# Patient Record
Sex: Male | Born: 1945
Health system: Southern US, Community
[De-identification: ages and names within clinical notes are randomized; demographics above are authoritative.]

## PROBLEM LIST (undated history)

## (undated) DIAGNOSIS — I509 Heart failure, unspecified: Secondary | ICD-10-CM

## (undated) DIAGNOSIS — I493 Ventricular premature depolarization: Secondary | ICD-10-CM

## (undated) HISTORY — PX: CIRCUMCISION: SUR203

## (undated) HISTORY — PX: HEMORRHOID SURGERY: SHX153

## (undated) HISTORY — PX: HERNIA REPAIR: SHX51

## (undated) HISTORY — DX: Heart failure, unspecified: I50.9

## (undated) HISTORY — DX: Ventricular premature depolarization: I49.3

---

## 2006-06-28 ENCOUNTER — Ambulatory Visit: Payer: Self-pay | Admitting: Gastroenterology

## 2014-07-29 ENCOUNTER — Ambulatory Visit: Payer: Self-pay | Admitting: Family Medicine

## 2015-08-27 ENCOUNTER — Encounter: Payer: Self-pay | Admitting: Family Medicine

## 2015-08-27 ENCOUNTER — Ambulatory Visit (INDEPENDENT_AMBULATORY_CARE_PROVIDER_SITE_OTHER): Payer: Medicare HMO | Admitting: Family Medicine

## 2015-08-27 VITALS — BP 138/78 | HR 65 | Temp 98.0°F | Resp 18 | Ht 73.0 in | Wt 197.5 lb

## 2015-08-27 DIAGNOSIS — Z8546 Personal history of malignant neoplasm of prostate: Secondary | ICD-10-CM

## 2015-08-27 DIAGNOSIS — Z0000 Encounter for general adult medical examination without abnormal findings: Secondary | ICD-10-CM

## 2015-08-27 DIAGNOSIS — G4700 Insomnia, unspecified: Secondary | ICD-10-CM | POA: Diagnosis not present

## 2015-08-27 DIAGNOSIS — Z559 Problems related to education and literacy, unspecified: Secondary | ICD-10-CM | POA: Diagnosis not present

## 2015-08-27 DIAGNOSIS — F17200 Nicotine dependence, unspecified, uncomplicated: Secondary | ICD-10-CM

## 2015-08-27 MED ORDER — TRAZODONE HCL 50 MG PO TABS: 25.0000 mg | ORAL_TABLET | Freq: Every evening | ORAL | Status: DC | PRN

## 2015-08-27 NOTE — Progress Notes (Signed)
Name: Anthony Richard   MRN: [id number]    DOB: [birth year]   Date:08/27/2015       Progress Note  Subjective  Chief Complaint  Chief Complaint  Patient presents with  . Annual Exam    HPI  69 year old male presenting for annual H&P.  Depression screen PHQ 2/9 08/27/2015  Decreased Interest 0  Down, Depressed, Hopeless 0  PHQ - 2 Score 0   Functional Status Survey: Is the patient deaf or have difficulty hearing?: No Does the patient have difficulty seeing, even when wearing glasses/contacts?: No Does the patient have difficulty concentrating, remembering, or making decisions?: No Does the patient have difficulty walking or climbing stairs?: No Does the patient have difficulty dressing or bathing?: No Does the patient have difficulty doing errands alone such as visiting a doctor's office or shopping?: No  Fall Risk  08/27/2015  Falls in the past year? No    History reviewed. No pertinent past medical history.  Social History  Substance Use Topics  . Smoking status: Current Every Day Smoker  . Smokeless tobacco: Not on file  . Alcohol Use: No     Current outpatient prescriptions:  .  Acetaminophen (TYLENOL ARTHRITIS EXT RELIEF PO), Take by mouth., Disp: , Rfl:   Allergies  Allergen Reactions  . Penicillins Swelling    Review of Systems  Constitutional: Negative for fever, chills and weight loss.  HENT: Negative for congestion, hearing loss, sore throat and tinnitus.   Eyes: Negative for blurred vision, double vision and redness.  Respiratory: Negative for cough, hemoptysis and shortness of breath.   Cardiovascular: Negative for chest pain, palpitations, orthopnea, claudication and leg swelling.  Gastrointestinal: Negative for heartburn, nausea, vomiting, diarrhea, constipation and blood in stool.  Genitourinary: Negative for dysuria, urgency, frequency and hematuria.  Musculoskeletal: Negative for myalgias, back pain, joint pain, falls and neck pain.  Skin:  Negative for itching.  Neurological: Negative for dizziness, tingling, tremors, focal weakness, seizures, loss of consciousness, weakness and headaches.  Endo/Heme/Allergies: Does not bruise/bleed easily.  Psychiatric/Behavioral: Negative for depression and substance abuse. The patient is not nervous/anxious and does not have insomnia.      Objective  Filed Vitals:   08/27/15 0921  BP: 138/78  Pulse: 65  Temp: 98 F (36.7 C)  Resp: 18  Height:  (1.854 m)  Weight: 197 lb 8 oz (89.585 kg)  SpO2: 97%     Physical Exam  Constitutional: He is oriented to person, place, and time and well-developed, well-nourished, and in no distress.  HENT:  Head: Normocephalic.  Eyes: EOM are normal. Pupils are equal, round, and reactive to light.  Neck: Normal range of motion. Neck supple. No thyromegaly present.  Cardiovascular: Normal rate, regular rhythm and normal heart sounds.   No murmur heard. Pulmonary/Chest: Effort normal and breath sounds normal. No respiratory distress. He has no wheezes.  Abdominal: Soft. Bowel sounds are normal.  Genitourinary: Rectum normal, prostate normal and penis normal. Guaiac negative stool. No discharge found.  Musculoskeletal: Normal range of motion. He exhibits no edema.  Lymphadenopathy:    He has no cervical adenopathy.  Neurological: He is alert and oriented to person, place, and time. No cranial nerve deficit. Gait normal. Coordination normal.  Skin: Skin is warm and dry. No rash noted.  Psychiatric: Affect and judgment normal.      Assessment & Plan  1. Annual physical exam   2. Needs smoking cessation education Handout is been given  3. Insomnia Trial of trazodone view  of his age - traZODone (DESYREL) 50 MG tablet; Take 0.5-1 tablets (25-50 mg total) by mouth at bedtime as needed for sleep.  Dispense: 30 tablet; Refill: 5  4. History of prostate cancer Obtain PSA and follow up with urologist as needed - PSA

## 2015-08-27 NOTE — Patient Instructions (Signed)
Smoking Cessation, Tips for Success If you are ready to quit smoking, congratulations! You have chosen to help yourself be healthier. Cigarettes bring nicotine, tar, carbon monoxide, and other irritants into your body. Your lungs, heart, and blood vessels will be able to work better without these poisons. There are many different ways to quit smoking. Nicotine gum, nicotine patches, a nicotine inhaler, or nicotine nasal spray can help with physical craving. Hypnosis, support groups, and medicines help break the habit of smoking. WHAT THINGS CAN I DO TO MAKE QUITTING EASIER?  Here are some tips to help you quit for good:  Pick a date when you will quit smoking completely. Tell all of your friends and family about your plan to quit on that date.  Do not try to slowly cut down on the number of cigarettes you are smoking. Pick a quit date and quit smoking completely starting on that day.  Throw away all cigarettes.   Clean and remove all ashtrays from your home, work, and car.  On a card, write down your reasons for quitting. Carry the card with you and read it when you get the urge to smoke.  Cleanse your body of nicotine. Drink enough water and fluids to keep your urine clear or pale yellow. Do this after quitting to flush the nicotine from your body.  Learn to predict your moods. Do not let a bad situation be your excuse to have a cigarette. Some situations in your life might tempt you into wanting a cigarette.  Never have "just one" cigarette. It leads to wanting another and another. Remind yourself of your decision to quit.  Change habits associated with smoking. If you smoked while driving or when feeling stressed, try other activities to replace smoking. Stand up when drinking your coffee. Brush your teeth after eating. Sit in a different chair when you read the paper. Avoid alcohol while trying to quit, and try to drink fewer caffeinated beverages. Alcohol and caffeine may urge you to  smoke.  Avoid foods and drinks that can trigger a desire to smoke, such as sugary or spicy foods and alcohol.  Ask people who smoke not to smoke around you.  Have something planned to do right after eating or having a cup of coffee. For example, plan to take a walk or exercise.  Try a relaxation exercise to calm you down and decrease your stress. Remember, you may be tense and nervous for the first 2 weeks after you quit, but this will pass.  Find new activities to keep your hands busy. Play with a pen, coin, or rubber band. Doodle or draw things on paper.  Brush your teeth right after eating. This will help cut down on the craving for the taste of tobacco after meals. You can also try mouthwash.   Use oral substitutes in place of cigarettes. Try using lemon drops, carrots, cinnamon sticks, or chewing gum. Keep them handy so they are available when you have the urge to smoke.  When you have the urge to smoke, try deep breathing.  Designate your home as a nonsmoking area.  If you are a heavy smoker, ask your health care provider about a prescription for nicotine chewing gum. It can ease your withdrawal from nicotine.  Reward yourself. Set aside the cigarette money you save and buy yourself something nice.  Look for support from others. Join a support group or smoking cessation program. Ask someone at home or at work to help you with your plan   to quit smoking.  Always ask yourself, "Do I need this cigarette or is this just a reflex?" Tell yourself, "Today, I choose not to smoke," or "I do not want to smoke." You are reminding yourself of your decision to quit.  Do not replace cigarette smoking with electronic cigarettes (commonly called e-cigarettes). The safety of e-cigarettes is unknown, and some may contain harmful chemicals.  If you relapse, do not give up! Plan ahead and think about what you will do the next time you get the urge to smoke. HOW WILL I FEEL WHEN I QUIT SMOKING? You  may have symptoms of withdrawal because your body is used to nicotine (the addictive substance in cigarettes). You may crave cigarettes, be irritable, feel very hungry, cough often, get headaches, or have difficulty concentrating. The withdrawal symptoms are only temporary. They are strongest when you first quit but will go away within 10-14 days. When withdrawal symptoms occur, stay in control. Think about your reasons for quitting. Remind yourself that these are signs that your body is healing and getting used to being without cigarettes. Remember that withdrawal symptoms are easier to treat than the major diseases that smoking can cause.  Even after the withdrawal is over, expect periodic urges to smoke. However, these cravings are generally short lived and will go away whether you smoke or not. Do not smoke! WHAT RESOURCES ARE AVAILABLE TO HELP ME QUIT SMOKING? Your health care provider can direct you to community resources or hospitals for support, which may include:  Group support.  Education.  Hypnosis.  Therapy.   This information is not intended to replace advice given to you by your health care provider. Make sure you discuss any questions you have with your health care provider.   Document Released: 05/14/2004 Document Revised: 09/06/2014 Document Reviewed: 02/01/2013 Elsevier Interactive Patient Education 2016 [employer]  

## 2015-09-24 DIAGNOSIS — Z0000 Encounter for general adult medical examination without abnormal findings: Secondary | ICD-10-CM | POA: Diagnosis not present

## 2015-09-25 ENCOUNTER — Telehealth: Payer: Self-pay | Admitting: Emergency Medicine

## 2015-09-25 LAB — PSA: PROSTATE SPECIFIC AG, SERUM: 1.3 ng/mL (ref 0.0–4.0)

## 2015-09-25 NOTE — Telephone Encounter (Signed)
Patient notified of labs letter sent.

## 2015-12-06 IMAGING — CR DG CHEST 2V
1 series · 2 of 2 positions shown · non-contrast
Comparison: None.

CLINICAL DATA: Clubbing of the nails; no respiratory symptoms;
nonsmoker

EXAM:
CHEST  2 VIEW

[Series 1: kdxr chest pa (or ap) and lat · 0.14mm/px · 2 of 2 slices shown]
[im 1/2]
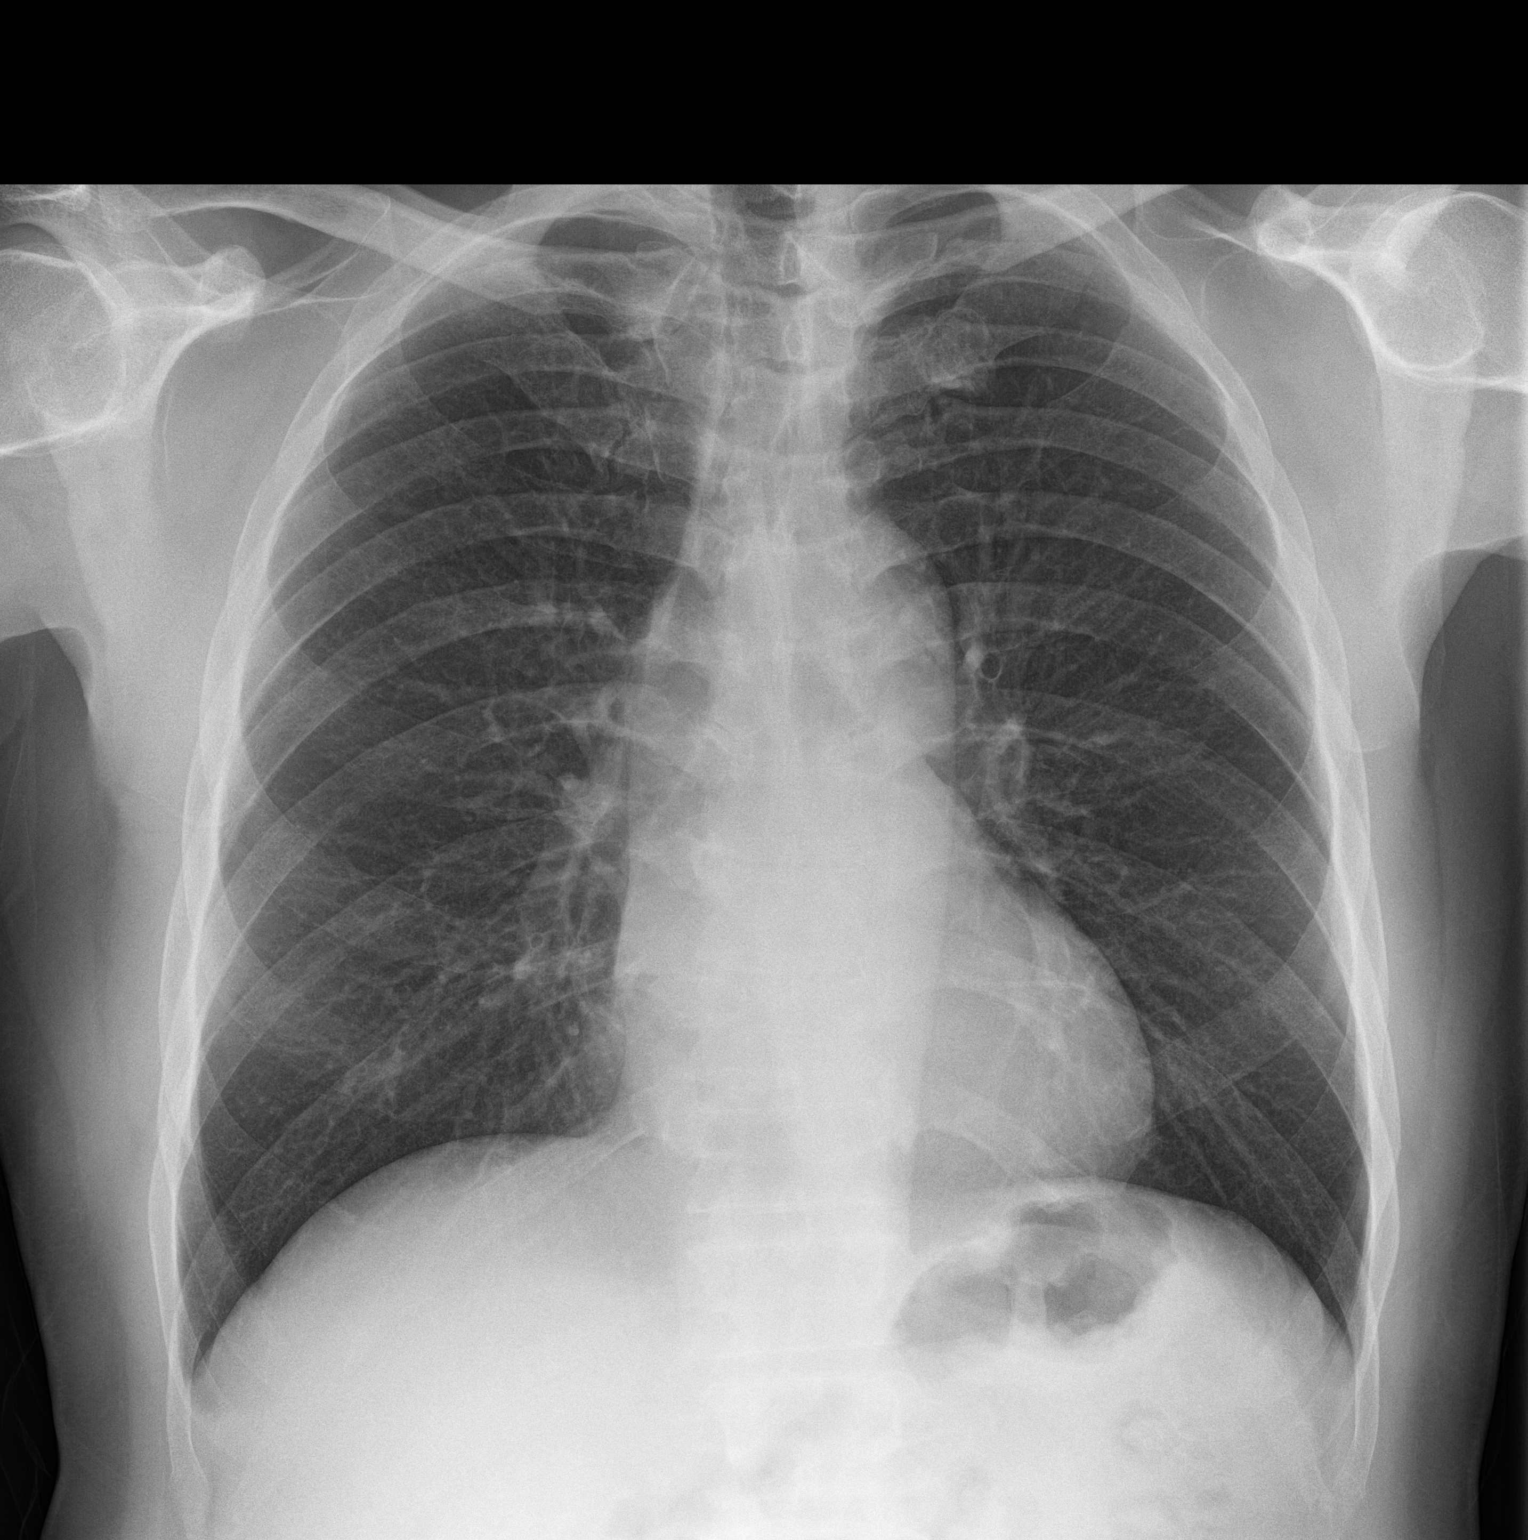
[im 2/2]
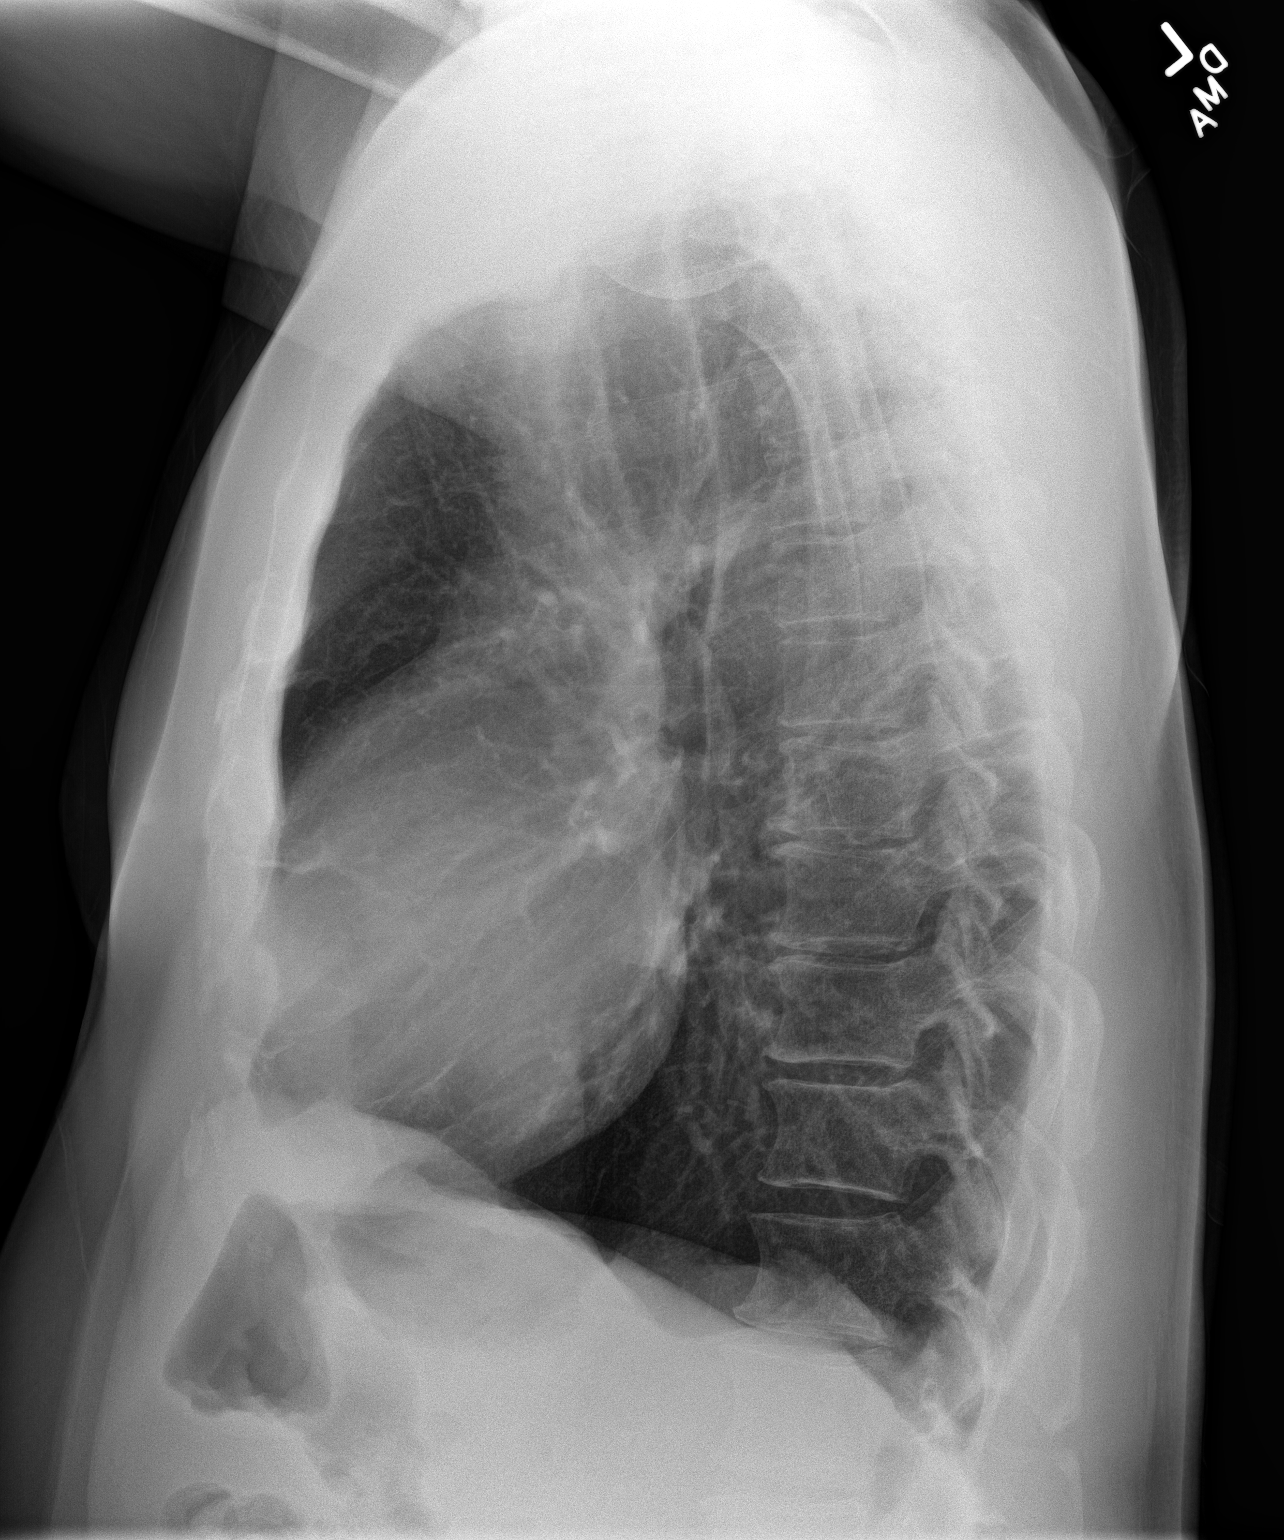

[2 of 2 positions shown; findings below may reference images not displayed]

FINDINGS: The lungs are mildly hyperinflated but clear. The heart and
pulmonary vascularity are normal. The trachea is midline. There is
no pleural effusion or pneumothorax. The bony thorax is
unremarkable.
IMPRESSION: Mild hyperinflation may be voluntary or could reflect underlying
COPD. There is no acute cardiopulmonary abnormality.

## 2016-02-25 ENCOUNTER — Ambulatory Visit: Payer: Medicare HMO | Admitting: Family Medicine

## 2016-12-27 ENCOUNTER — Telehealth: Payer: Self-pay | Admitting: Family Medicine

## 2016-12-27 NOTE — Telephone Encounter (Signed)
Called Pt to schedule AWV with [facility] - knb °

## 2018-01-16 LAB — FECAL OCCULT BLOOD, GUAIAC: FECAL OCCULT BLD: NEGATIVE

## 2018-01-24 ENCOUNTER — Encounter: Payer: Self-pay | Admitting: Family Medicine

## 2018-02-02 DIAGNOSIS — Z1159 Encounter for screening for other viral diseases: Secondary | ICD-10-CM | POA: Diagnosis not present

## 2018-02-02 DIAGNOSIS — R9431 Abnormal electrocardiogram [ECG] [EKG]: Secondary | ICD-10-CM | POA: Diagnosis not present

## 2018-02-02 DIAGNOSIS — Z125 Encounter for screening for malignant neoplasm of prostate: Secondary | ICD-10-CM | POA: Diagnosis not present

## 2018-02-02 DIAGNOSIS — R609 Edema, unspecified: Secondary | ICD-10-CM | POA: Diagnosis not present

## 2018-02-02 DIAGNOSIS — I83893 Varicose veins of bilateral lower extremities with other complications: Secondary | ICD-10-CM | POA: Diagnosis not present

## 2018-02-02 DIAGNOSIS — Z1211 Encounter for screening for malignant neoplasm of colon: Secondary | ICD-10-CM | POA: Diagnosis not present

## 2018-02-02 DIAGNOSIS — I499 Cardiac arrhythmia, unspecified: Secondary | ICD-10-CM | POA: Diagnosis not present

## 2018-03-06 DIAGNOSIS — R9431 Abnormal electrocardiogram [ECG] [EKG]: Secondary | ICD-10-CM | POA: Diagnosis not present

## 2018-03-06 DIAGNOSIS — R683 Clubbing of fingers: Secondary | ICD-10-CM | POA: Diagnosis not present

## 2018-03-06 DIAGNOSIS — I499 Cardiac arrhythmia, unspecified: Secondary | ICD-10-CM | POA: Diagnosis not present

## 2018-03-06 DIAGNOSIS — R609 Edema, unspecified: Secondary | ICD-10-CM | POA: Diagnosis not present

## 2018-03-17 DIAGNOSIS — R0602 Shortness of breath: Secondary | ICD-10-CM | POA: Diagnosis not present

## 2018-03-17 DIAGNOSIS — R002 Palpitations: Secondary | ICD-10-CM | POA: Diagnosis not present

## 2018-03-17 DIAGNOSIS — R600 Localized edema: Secondary | ICD-10-CM | POA: Diagnosis not present

## 2018-03-17 DIAGNOSIS — I493 Ventricular premature depolarization: Secondary | ICD-10-CM | POA: Diagnosis not present

## 2018-03-29 DIAGNOSIS — R0602 Shortness of breath: Secondary | ICD-10-CM | POA: Diagnosis not present

## 2018-06-02 DIAGNOSIS — N183 Chronic kidney disease, stage 3 (moderate): Secondary | ICD-10-CM | POA: Diagnosis not present

## 2018-06-02 DIAGNOSIS — R609 Edema, unspecified: Secondary | ICD-10-CM | POA: Diagnosis not present

## 2018-06-02 DIAGNOSIS — R9431 Abnormal electrocardiogram [ECG] [EKG]: Secondary | ICD-10-CM | POA: Diagnosis not present

## 2018-06-02 DIAGNOSIS — I499 Cardiac arrhythmia, unspecified: Secondary | ICD-10-CM | POA: Diagnosis not present

## 2018-06-02 DIAGNOSIS — Z87891 Personal history of nicotine dependence: Secondary | ICD-10-CM | POA: Diagnosis not present

## 2018-06-02 DIAGNOSIS — R7989 Other specified abnormal findings of blood chemistry: Secondary | ICD-10-CM | POA: Diagnosis not present

## 2018-07-07 DIAGNOSIS — Z87891 Personal history of nicotine dependence: Secondary | ICD-10-CM | POA: Diagnosis not present

## 2018-07-07 DIAGNOSIS — R899 Unspecified abnormal finding in specimens from other organs, systems and tissues: Secondary | ICD-10-CM | POA: Diagnosis not present

## 2018-07-07 DIAGNOSIS — I493 Ventricular premature depolarization: Secondary | ICD-10-CM | POA: Diagnosis not present

## 2018-07-07 DIAGNOSIS — R600 Localized edema: Secondary | ICD-10-CM | POA: Diagnosis not present

## 2018-07-07 DIAGNOSIS — G4700 Insomnia, unspecified: Secondary | ICD-10-CM | POA: Diagnosis not present

## 2018-07-14 DIAGNOSIS — R079 Chest pain, unspecified: Secondary | ICD-10-CM | POA: Diagnosis not present

## 2018-07-14 DIAGNOSIS — R0602 Shortness of breath: Secondary | ICD-10-CM | POA: Diagnosis not present

## 2018-07-14 DIAGNOSIS — I493 Ventricular premature depolarization: Secondary | ICD-10-CM | POA: Diagnosis not present

## 2018-07-14 DIAGNOSIS — R600 Localized edema: Secondary | ICD-10-CM | POA: Diagnosis not present

## 2018-07-14 DIAGNOSIS — I059 Rheumatic mitral valve disease, unspecified: Secondary | ICD-10-CM | POA: Diagnosis not present

## 2018-07-14 DIAGNOSIS — I429 Cardiomyopathy, unspecified: Secondary | ICD-10-CM | POA: Diagnosis not present

## 2018-07-18 DIAGNOSIS — R0602 Shortness of breath: Secondary | ICD-10-CM | POA: Diagnosis not present

## 2018-07-18 DIAGNOSIS — R899 Unspecified abnormal finding in specimens from other organs, systems and tissues: Secondary | ICD-10-CM | POA: Diagnosis not present

## 2018-07-18 DIAGNOSIS — R600 Localized edema: Secondary | ICD-10-CM | POA: Diagnosis not present

## 2018-07-18 DIAGNOSIS — I493 Ventricular premature depolarization: Secondary | ICD-10-CM | POA: Diagnosis not present

## 2018-07-18 DIAGNOSIS — I059 Rheumatic mitral valve disease, unspecified: Secondary | ICD-10-CM | POA: Diagnosis not present

## 2018-07-18 DIAGNOSIS — I429 Cardiomyopathy, unspecified: Secondary | ICD-10-CM | POA: Diagnosis not present

## 2018-07-18 DIAGNOSIS — G4700 Insomnia, unspecified: Secondary | ICD-10-CM | POA: Diagnosis not present

## 2018-07-18 DIAGNOSIS — R002 Palpitations: Secondary | ICD-10-CM | POA: Diagnosis not present

## 2018-08-04 DIAGNOSIS — R079 Chest pain, unspecified: Secondary | ICD-10-CM | POA: Diagnosis not present

## 2018-08-04 DIAGNOSIS — I493 Ventricular premature depolarization: Secondary | ICD-10-CM | POA: Diagnosis not present

## 2018-08-04 DIAGNOSIS — R0602 Shortness of breath: Secondary | ICD-10-CM | POA: Diagnosis not present

## 2018-08-04 DIAGNOSIS — R600 Localized edema: Secondary | ICD-10-CM | POA: Diagnosis not present

## 2018-08-15 DIAGNOSIS — R600 Localized edema: Secondary | ICD-10-CM | POA: Diagnosis not present

## 2018-08-15 DIAGNOSIS — I059 Rheumatic mitral valve disease, unspecified: Secondary | ICD-10-CM | POA: Diagnosis not present

## 2018-08-15 DIAGNOSIS — I493 Ventricular premature depolarization: Secondary | ICD-10-CM | POA: Diagnosis not present

## 2018-08-15 DIAGNOSIS — I429 Cardiomyopathy, unspecified: Secondary | ICD-10-CM | POA: Diagnosis not present

## 2018-08-29 DIAGNOSIS — N189 Chronic kidney disease, unspecified: Secondary | ICD-10-CM | POA: Diagnosis not present

## 2018-08-29 DIAGNOSIS — N3289 Other specified disorders of bladder: Secondary | ICD-10-CM | POA: Diagnosis not present

## 2018-08-29 DIAGNOSIS — N261 Atrophy of kidney (terminal): Secondary | ICD-10-CM | POA: Diagnosis not present

## 2018-10-04 DIAGNOSIS — I340 Nonrheumatic mitral (valve) insufficiency: Secondary | ICD-10-CM | POA: Diagnosis not present

## 2018-10-04 DIAGNOSIS — I509 Heart failure, unspecified: Secondary | ICD-10-CM | POA: Diagnosis not present

## 2018-10-04 DIAGNOSIS — I493 Ventricular premature depolarization: Secondary | ICD-10-CM | POA: Diagnosis not present

## 2018-10-04 DIAGNOSIS — N3289 Other specified disorders of bladder: Secondary | ICD-10-CM | POA: Diagnosis not present

## 2018-10-04 DIAGNOSIS — I5020 Unspecified systolic (congestive) heart failure: Secondary | ICD-10-CM | POA: Diagnosis not present

## 2018-10-04 DIAGNOSIS — Z87891 Personal history of nicotine dependence: Secondary | ICD-10-CM | POA: Diagnosis not present

## 2018-10-04 DIAGNOSIS — N329 Bladder disorder, unspecified: Secondary | ICD-10-CM | POA: Diagnosis not present

## 2018-10-04 DIAGNOSIS — R9431 Abnormal electrocardiogram [ECG] [EKG]: Secondary | ICD-10-CM | POA: Diagnosis not present

## 2018-10-04 DIAGNOSIS — Z01811 Encounter for preprocedural respiratory examination: Secondary | ICD-10-CM | POA: Diagnosis not present

## 2018-10-04 DIAGNOSIS — Z880 Allergy status to penicillin: Secondary | ICD-10-CM | POA: Diagnosis not present

## 2018-10-04 DIAGNOSIS — N183 Chronic kidney disease, stage 3 (moderate): Secondary | ICD-10-CM | POA: Diagnosis not present

## 2018-10-04 DIAGNOSIS — R001 Bradycardia, unspecified: Secondary | ICD-10-CM | POA: Diagnosis not present

## 2018-10-06 DIAGNOSIS — I493 Ventricular premature depolarization: Secondary | ICD-10-CM | POA: Diagnosis not present

## 2018-10-06 DIAGNOSIS — R001 Bradycardia, unspecified: Secondary | ICD-10-CM | POA: Diagnosis not present

## 2018-10-06 DIAGNOSIS — R002 Palpitations: Secondary | ICD-10-CM | POA: Diagnosis not present

## 2018-10-06 DIAGNOSIS — I10 Essential (primary) hypertension: Secondary | ICD-10-CM | POA: Diagnosis not present

## 2018-10-06 DIAGNOSIS — R600 Localized edema: Secondary | ICD-10-CM | POA: Diagnosis not present

## 2018-10-06 DIAGNOSIS — I429 Cardiomyopathy, unspecified: Secondary | ICD-10-CM | POA: Diagnosis not present

## 2018-11-02 DIAGNOSIS — I493 Ventricular premature depolarization: Secondary | ICD-10-CM | POA: Diagnosis not present

## 2018-11-02 DIAGNOSIS — I429 Cardiomyopathy, unspecified: Secondary | ICD-10-CM | POA: Diagnosis not present

## 2018-11-02 DIAGNOSIS — R600 Localized edema: Secondary | ICD-10-CM | POA: Diagnosis not present

## 2018-11-02 DIAGNOSIS — R002 Palpitations: Secondary | ICD-10-CM | POA: Diagnosis not present

## 2018-11-02 DIAGNOSIS — I059 Rheumatic mitral valve disease, unspecified: Secondary | ICD-10-CM | POA: Diagnosis not present

## 2018-11-08 DIAGNOSIS — I509 Heart failure, unspecified: Secondary | ICD-10-CM | POA: Diagnosis not present

## 2018-11-08 DIAGNOSIS — Z880 Allergy status to penicillin: Secondary | ICD-10-CM | POA: Diagnosis not present

## 2018-11-08 DIAGNOSIS — N3289 Other specified disorders of bladder: Secondary | ICD-10-CM | POA: Diagnosis not present

## 2018-11-08 DIAGNOSIS — R9341 Abnormal radiologic findings on diagnostic imaging of renal pelvis, ureter, or bladder: Secondary | ICD-10-CM | POA: Diagnosis not present

## 2018-11-08 DIAGNOSIS — N2882 Megaloureter: Secondary | ICD-10-CM | POA: Diagnosis not present

## 2018-11-08 DIAGNOSIS — N329 Bladder disorder, unspecified: Secondary | ICD-10-CM | POA: Diagnosis not present

## 2018-11-08 DIAGNOSIS — Z87891 Personal history of nicotine dependence: Secondary | ICD-10-CM | POA: Diagnosis not present

## 2018-11-08 DIAGNOSIS — N189 Chronic kidney disease, unspecified: Secondary | ICD-10-CM | POA: Diagnosis not present

## 2018-11-30 DIAGNOSIS — I429 Cardiomyopathy, unspecified: Secondary | ICD-10-CM | POA: Diagnosis not present

## 2018-11-30 DIAGNOSIS — I059 Rheumatic mitral valve disease, unspecified: Secondary | ICD-10-CM | POA: Diagnosis not present

## 2019-04-11 ENCOUNTER — Other Ambulatory Visit: Payer: Self-pay | Admitting: Medical Oncology

## 2019-04-11 DIAGNOSIS — Z136 Encounter for screening for cardiovascular disorders: Secondary | ICD-10-CM

## 2020-09-25 ENCOUNTER — Other Ambulatory Visit: Payer: Self-pay | Admitting: Family Medicine

## 2020-09-25 DIAGNOSIS — Z136 Encounter for screening for cardiovascular disorders: Secondary | ICD-10-CM

## 2020-11-25 LAB — COLOGUARD

## 2021-02-11 LAB — COLOGUARD

## 2022-06-22 ENCOUNTER — Other Ambulatory Visit (HOSPITAL_COMMUNITY): Payer: Self-pay | Admitting: Family Medicine

## 2022-06-22 ENCOUNTER — Other Ambulatory Visit: Payer: Self-pay | Admitting: Family Medicine

## 2022-06-22 DIAGNOSIS — Z87891 Personal history of nicotine dependence: Secondary | ICD-10-CM

## 2022-06-22 DIAGNOSIS — Z136 Encounter for screening for cardiovascular disorders: Secondary | ICD-10-CM

## 2022-07-02 ENCOUNTER — Ambulatory Visit: Payer: Self-pay

## 2023-04-25 ENCOUNTER — Telehealth: Payer: Self-pay

## 2023-04-25 NOTE — Telephone Encounter (Addendum)
Attempted to sched pt from [facility] referral. No answer and no voicemail set up.   05/02/29 Pt scheduled for 9/9 @ 1500.

## 2023-05-06 NOTE — Progress Notes (Unsigned)
Advanced Heart Failure Clinic Note   Referring Physician: Fermin Schwab, PA [facility]) PCP: Anthony Mascot, MD Cardiologist: Anthony Peng, MD (last seen 08/24)  HPI:  [name] is a 77 y/o male with a history of  Has not been admitted or been in the ED in the last 6 months.   Echo 04/11/23: EF 25%  Stress test 04/11/23: 1.  Negative ETT though specificity limited by baseline ECG abnormalities  2.  Severely reduced left ventricular function with estimated LV ejection fraction 20%  3.  Global hypokinesis  4.  No tentative evidence of for scar or ischemia   He presents today for his initial visit with a chief complaint of    Review of Systems: [y] = yes, [ ]  = no   General: Weight gain [ ] ; Weight loss [ ] ; Anorexia [ ] ; Fatigue [ ] ; Fever [ ] ; Chills [ ] ; Weakness [ ]   Cardiac: Chest pain/pressure [ ] ; Resting SOB [ ] ; Exertional SOB [ ] ; Orthopnea [ ] ; Pedal Edema [ ] ; Palpitations [ ] ; Syncope [ ] ; Presyncope [ ] ; Paroxysmal nocturnal dyspnea[ ]   Pulmonary: Cough [ ] ; Wheezing[ ] ; Hemoptysis[ ] ; Sputum [ ] ; Snoring [ ]   GI: Vomiting[ ] ; Dysphagia[ ] ; Melena[ ] ; Hematochezia [ ] ; Heartburn[ ] ; Abdominal pain [ ] ; Constipation [ ] ; Diarrhea [ ] ; BRBPR [ ]   GU: Hematuria[ ] ; Dysuria [ ] ; Nocturia[ ]   Vascular: Pain in legs with walking [ ] ; Pain in feet with lying flat [ ] ; Non-healing sores [ ] ; Stroke [ ] ; TIA [ ] ; Slurred speech [ ] ;  Neuro: Headaches[ ] ; Vertigo[ ] ; Seizures[ ] ; Paresthesias[ ] ;Blurred vision [ ] ; Diplopia [ ] ; Vision changes [ ]   Ortho/Skin: Arthritis [ ] ; Joint pain [ ] ; Muscle pain [ ] ; Joint swelling [ ] ; Back Pain [ ] ; Rash [ ]   Psych: Depression[ ] ; Anxiety[ ]   Heme: Bleeding problems [ ] ; Clotting disorders [ ] ; Anemia [ ]   Endocrine: Diabetes [ ] ; Thyroid dysfunction[ ]    No past medical history on file.  Current Outpatient Medications  Medication Sig Dispense Refill   Acetaminophen (TYLENOL ARTHRITIS EXT RELIEF PO) Take by mouth.      traZODone (DESYREL) 50 MG tablet Take 0.5-1 tablets (25-50 mg total) by mouth at bedtime as needed for sleep. 30 tablet 5   No current facility-administered medications for this visit.    Allergies  Allergen Reactions   Penicillins Swelling      Social History   Socioeconomic History   Marital status: Married    Spouse name: Not on file   Number of children: Not on file   Years of education: Not on file   Highest education level: Not on file  Occupational History   Not on file  Tobacco Use   Smoking status: Every Day   Smokeless tobacco: Not on file  Substance and Sexual Activity   Alcohol use: No    Alcohol/week: 0.0 standard drinks of alcohol   Drug use: No   Sexual activity: Not on file  Other Topics Concern   Not on file  Social History Narrative   Not on file   Social Determinants of Health   Financial Resource Strain: Not on file  Food Insecurity: Not on file  Transportation Needs: Not on file  Physical Activity: Not on file  Stress: Not on file  Social Connections: Not on file  Intimate Partner Violence: Not on file     No family history on file.  PHYSICAL EXAM: General:  Well appearing. No respiratory difficulty HEENT: normal Neck: supple. no JVD. Carotids 2+ bilat; no bruits. No lymphadenopathy or thyromegaly appreciated. Cor: PMI nondisplaced. Regular rate & rhythm. No rubs, gallops or murmurs. Lungs: clear Abdomen: soft, nontender, nondistended. No hepatosplenomegaly. No bruits or masses. Good bowel sounds. Extremities: no cyanosis, clubbing, rash, edema Neuro: alert & oriented x 3, cranial nerves grossly intact. moves all 4 extremities w/o difficulty. Affect pleasant.  ECG:   ASSESSMENT & PLAN:  1: Chronic heart failure with reduced ejection fraction- - suspect due to  - NYHA class - euvolemic - weighing daily - Echo 04/11/23: EF 25% - Stress test 04/11/23:   1.  Negative ETT though specificity limited by baseline ECG  abnormalities    2.  Severely reduced left ventricular function with estimated LV ejection fraction 20%    3.  Global hypokinesis    4.  No tentative evidence of for scar or ischemia   - continue    2: PVC's- - saw cardiology Anthony Richard)  - prior holter monitoring in 2019 with 47% PVC burden  - BMP 11/01/22 showed sodium 137, potassium 4.3, creatinine 1.5 & GFR [address]  Anthony Richard, [place] 05/06/23

## 2023-05-09 ENCOUNTER — Ambulatory Visit (HOSPITAL_BASED_OUTPATIENT_CLINIC_OR_DEPARTMENT_OTHER): Payer: Medicare HMO | Admitting: Family

## 2023-05-09 ENCOUNTER — Other Ambulatory Visit
Admission: RE | Admit: 2023-05-09 | Discharge: 2023-05-09 | Disposition: A | Discharge status: Home / Self Care | Payer: Medicare HMO | Source: Ambulatory Visit | Attending: Family | Admitting: Family

## 2023-05-09 ENCOUNTER — Other Ambulatory Visit (HOSPITAL_COMMUNITY): Payer: Self-pay

## 2023-05-09 ENCOUNTER — Telehealth (HOSPITAL_COMMUNITY): Payer: Self-pay

## 2023-05-09 ENCOUNTER — Encounter: Payer: Self-pay | Admitting: Family

## 2023-05-09 VITALS — BP 123/73 | HR 75 | Wt 202.5 lb

## 2023-05-09 DIAGNOSIS — I493 Ventricular premature depolarization: Secondary | ICD-10-CM

## 2023-05-09 DIAGNOSIS — I5022 Chronic systolic (congestive) heart failure: Secondary | ICD-10-CM | POA: Diagnosis present

## 2023-05-09 LAB — BASIC METABOLIC PANEL
Anion gap: 7 (ref 5–15)
BUN: 24 mg/dL — ABNORMAL HIGH (ref 8–23)
CO2: 25 mmol/L (ref 22–32)
Calcium: 9.1 mg/dL (ref 8.9–10.3)
Chloride: 107 mmol/L (ref 98–111)
Creatinine, Ser: 1.33 mg/dL — ABNORMAL HIGH (ref 0.61–1.24)
GFR, Estimated: 55 mL/min — ABNORMAL LOW (ref >60)
Glucose, Bld: 98 mg/dL (ref 70–99)
Potassium: 4.3 mmol/L (ref 3.5–5.1)
Sodium: 139 mmol/L (ref 135–145)

## 2023-05-09 MED ORDER — EMPAGLIFLOZIN 10 MG PO TABS: 10.0000 mg | ORAL_TABLET | Freq: Every day | ORAL | 5 refills | Status: DC

## 2023-05-09 NOTE — Telephone Encounter (Signed)
Advanced Heart Failure Patient Advocate Encounter  The patient was approved for a [employer] grant that will help cover the cost of Carvedilol, Jardiance, Losartan.  Total amount awarded, $10,000.  Effective: 04/09/2023 - 04/07/2024.  BIN [id number] PCN [id number] [id number] ID [id number]  Pharmacy provided with approval and processing information. Patient informed via phone.  Burnell Blanks, CPhT Rx Patient Advocate Phone: [phone]

## 2023-05-09 NOTE — Patient Instructions (Addendum)
[facility]  Start wearing compression socks daily with removal at bedtime.   Bring medication bottles to every visit.   Take the carvedilol as 1 tablet in the morning AND 1 tablet in the evening  Continue your current losartan dose by taking 2 tablets daily

## 2023-05-27 NOTE — Progress Notes (Unsigned)
PCP: Primary Cardiologist: Fermin Schwab, PA (last seen 08/24)  HPI:   Anthony Richard is a 77 y/o male with a history of PVC's, moderate mitral insufficiency, previous tobacco use (quit 2019) and chronic heart failure.   Has not been admitted or been in the ED in the last 6 months.   Echo 04/11/23: EF 25%  Stress test 04/11/23: 1.  Negative ETT though specificity limited by baseline ECG abnormalities  2.  Severely reduced left ventricular function with estimated LV ejection fraction 20%  3.  Global hypokinesis  4.  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 His wife of 55 years is with him because sometimes he gets his meds confused.   At last visit, his carvedilol was increased to BID as he had been taking it once daily. [place] Pepper was also started.     ROS: All systems negative except as listed in HPI, PMH and Problem List.  SH:  Social History   Socioeconomic History   Marital status: Married    Spouse name: Not on file   Number of children: Not on file   Years of education: Not on file   Highest education level: Not on file  Occupational History   Not on file  Tobacco Use   Smoking status: Former    Current packs/day: 0.00    Types: Cigarettes    Quit date: 2019    Years since quitting: 5.7   Smokeless tobacco: Not on file  Substance and Sexual Activity   Alcohol use: No    Alcohol/week: 0.0 standard drinks of alcohol   Drug use: No   Sexual activity: Not on file  Other Topics Concern   Not on file  Social History Narrative   Not on file   Social Determinants of Health   Financial Resource Strain: Not on file  Food Insecurity: Not on file  Transportation Needs: Not on file  Physical Activity: Not on file  Stress: Not on file  Social Connections: Not on file  Intimate Partner Violence: Not on file    FH: No family history  on file.  Past Medical History:  Diagnosis Date   CHF (congestive heart failure) (HCC)    PVC (premature ventricular contraction)     Current Outpatient Medications  Medication Sig Dispense Refill   aspirin EC 81 MG tablet Take 81 mg by mouth daily. Swallow whole.     carvedilol (COREG) 3.125 MG tablet Take 3.125 mg by mouth 2 (two) times daily with a meal.     empagliflozin (JARDIANCE) 10 MG TABS tablet Take 1 tablet (10 mg total) by mouth daily before breakfast. 30 tablet 5   losartan (COZAAR) 50 MG tablet Take 100 mg by mouth daily.     No current facility-administered medications for this visit.      PHYSICAL EXAM:  General:  Well appearing. No resp difficulty HEENT: normal Neck: supple. JVP flat. No lymphadenopathy or thryomegaly appreciated. Cor: PMI normal. Regular rate & rhythm. No rubs, gallops or murmurs. Lungs: clear Abdomen: soft, nontender, nondistended. No hepatosplenomegaly. No bruits or masses.  Extremities: no cyanosis, clubbing, rash, edema Neuro: alert & oriented x3, cranial nerves grossly intact. Moves all 4 extremities w/o difficulty. Affect pleasant.   ECG:   ASSESSMENT & PLAN:  1: NICM with reduced ejection fraction- - unclear of etiology but  recent stress test was negative for scar/ ischemia - NYHA class II - euvolemic - weighing daily; reminded to call for an overnight weight gain of > 2 pounds or a weekly weight gain of > 5 pounds - weight 202.8 from last visit here 3 weeks ago - Echo 04/11/23: EF 25% - Stress test 04/11/23:   1.  Negative ETT though specificity limited by baseline ECG abnormalities    2.  Severely reduced left ventricular function with estimated LV ejection fraction 20%    3.  Global hypokinesis    4.  No tentative evidence of scar or ischemia  - consider cMRI for further evaluation - not adding salt to his food  - continue losartan 100mg  daily; transition to entresto - continue carvedilol 3.125mg  but BID - continue  jardiance 10mg  daily - BMP today since jardiance added at last visit - discussed also adding spironolactone - explained the importance of wearing compression socks daily with removal at bedtime and to elevate his feet when sitting for long periods of time  2: PVC's- - saw cardiology Wilson Singer) 08/24 - prior holter monitoring in 2019 with 47% PVC burden  - consider 2 week zio - BMP 05/09/23 showed sodium 139, potassium 4.3, creatinine 1.33 & GFR 55

## 2023-05-30 ENCOUNTER — Ambulatory Visit: Payer: Medicare HMO | Attending: Family | Admitting: Family

## 2023-05-30 ENCOUNTER — Other Ambulatory Visit: Payer: Self-pay | Admitting: Family

## 2023-05-30 ENCOUNTER — Encounter: Payer: Self-pay | Admitting: Family

## 2023-05-30 ENCOUNTER — Inpatient Hospital Stay (HOSPITAL_COMMUNITY)
Admission: RE | Admit: 2023-05-30 | Discharge: 2023-05-30 | Disposition: A | Discharge status: Home / Self Care | Payer: Medicare HMO | Source: Ambulatory Visit | Attending: Family | Admitting: Family

## 2023-05-30 VITALS — BP 138/80 | HR 47 | Wt 204.0 lb

## 2023-05-30 DIAGNOSIS — Z79899 Other long term (current) drug therapy: Secondary | ICD-10-CM | POA: Insufficient documentation

## 2023-05-30 DIAGNOSIS — I340 Nonrheumatic mitral (valve) insufficiency: Secondary | ICD-10-CM | POA: Insufficient documentation

## 2023-05-30 DIAGNOSIS — I509 Heart failure, unspecified: Secondary | ICD-10-CM | POA: Insufficient documentation

## 2023-05-30 DIAGNOSIS — I493 Ventricular premature depolarization: Secondary | ICD-10-CM | POA: Diagnosis not present

## 2023-05-30 DIAGNOSIS — Z87891 Personal history of nicotine dependence: Secondary | ICD-10-CM | POA: Diagnosis not present

## 2023-05-30 DIAGNOSIS — I5022 Chronic systolic (congestive) heart failure: Secondary | ICD-10-CM | POA: Diagnosis not present

## 2023-05-30 DIAGNOSIS — I428 Other cardiomyopathies: Secondary | ICD-10-CM | POA: Diagnosis present

## 2023-05-30 MED ORDER — ENTRESTO 49-51 MG PO TABS: 1.0000 | ORAL_TABLET | Freq: Two times a day (BID) | ORAL | 3 refills | Status: DC

## 2023-05-30 NOTE — Patient Instructions (Signed)
STOP Losartan  START Entresto 49/52 mg twice daily TOMORROW.  Follow up in 3-4 weeks.   Do the following things EVERYDAY: Weigh yourself in the morning before breakfast. Write it down and keep it in a log. Take your medicines as prescribed Eat low salt foods--Limit salt (sodium) to 2000 mg per day.  Stay as active as you can everyday Limit all fluids for the day to less than 2 liters

## 2023-06-20 ENCOUNTER — Ambulatory Visit (HOSPITAL_BASED_OUTPATIENT_CLINIC_OR_DEPARTMENT_OTHER): Payer: Medicare HMO | Admitting: Family

## 2023-06-20 ENCOUNTER — Other Ambulatory Visit
Admission: RE | Admit: 2023-06-20 | Discharge: 2023-06-20 | Disposition: A | Discharge status: Home / Self Care | Payer: Medicare HMO | Source: Ambulatory Visit | Attending: Family | Admitting: Family

## 2023-06-20 ENCOUNTER — Encounter: Payer: Self-pay | Admitting: Family

## 2023-06-20 VITALS — BP 117/70 | HR 63 | Resp 14 | Wt 203.5 lb

## 2023-06-20 DIAGNOSIS — I5022 Chronic systolic (congestive) heart failure: Secondary | ICD-10-CM | POA: Insufficient documentation

## 2023-06-20 DIAGNOSIS — I493 Ventricular premature depolarization: Secondary | ICD-10-CM

## 2023-06-20 LAB — BASIC METABOLIC PANEL
Anion gap: 5 (ref 5–15)
BUN: 21 mg/dL (ref 8–23)
CO2: 25 mmol/L (ref 22–32)
Calcium: 9 mg/dL (ref 8.9–10.3)
Chloride: 105 mmol/L (ref 98–111)
Creatinine, Ser: 1.35 mg/dL — ABNORMAL HIGH (ref 0.61–1.24)
GFR, Estimated: 54 mL/min — ABNORMAL LOW (ref >60)
Glucose, Bld: 104 mg/dL — ABNORMAL HIGH (ref 70–99)
Potassium: 4.5 mmol/L (ref 3.5–5.1)
Sodium: 135 mmol/L (ref 135–145)

## 2023-06-20 NOTE — Patient Instructions (Addendum)
Go [facility]. Go pass the gift shop and have your blood work completed.  Make sure you take your entresto and carvedilol in the morning and in the evening.   Wear your compression socks during the day and remove them before bedtime.

## 2023-06-20 NOTE — Progress Notes (Signed)
PCP: Dennison Mascot, MD Primary Cardiologist: Fermin Schwab, PA (last seen 08/24)  HPI:  Anthony Richard is a 77 y/o male with a history of PVC's, moderate mitral insufficiency, previous tobacco use (quit 2019) and chronic heart failure.   Has not been admitted or been in the ED in the last 6 months.   Echo 04/11/23: EF 25%  Stress test 04/11/23: 1.  Negative ETT though specificity limited by baseline ECG abnormalities  2.  Severely reduced left ventricular function with estimated LV ejection fraction 20%  3.  Global hypokinesis  4.  No tentative evidence of for scar or ischemia   He presents today with a chief complaint of a HF follow-up and voices no complaints. Specifically denies fatigue, shortness of breath, cough, chest pain, palpitations, abdominal distention, pedal edema, dizziness, difficulty sleeping or weight gain. Family member present feels like he is voiding "a lot" but patient doesn't feel like it's anything unusual.   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 Does not take the flu vaccine.   At last visit, losartan was stopped and entresto 49/51mg  BID was started although he has only been taking entresto once daily as he forgot that it should be BID. Confirms that he takes the carvedilol BID.    ROS: All systems negative except as listed in HPI, PMH and Problem List.  SH:  Social History   Socioeconomic History   Marital status: Married    Spouse name: Not on file   Number of children: Not on file   Years of education: Not on file   Highest education level: Not on file  Occupational History   Not on file  Tobacco Use   Smoking status: Former    Current packs/day: 0.00    Types: Cigarettes    Quit date: 2019    Years since quitting: 5.8   Smokeless tobacco: Not on file  Substance and Sexual Activity   Alcohol use: No    Alcohol/week: 0.0 standard drinks of alcohol   Drug use: No   Sexual activity: Not on file  Other Topics Concern   Not on  file  Social History Narrative   Not on file   Social Determinants of Health   Financial Resource Strain: Not on file  Food Insecurity: Not on file  Transportation Needs: Not on file  Physical Activity: Not on file  Stress: Not on file  Social Connections: Not on file  Intimate Partner Violence: Not on file    FH: No family history on file.  Past Medical History:  Diagnosis Date   CHF (congestive heart failure) (HCC)    PVC (premature ventricular contraction)     Current Outpatient Medications  Medication Sig Dispense Refill   aspirin EC 81 MG tablet Take 81 mg by mouth daily. Swallow whole.     carvedilol (COREG) 3.125 MG tablet Take 3.125 mg by mouth 2 (two) times daily with a meal.     empagliflozin (JARDIANCE) 10 MG TABS tablet Take 1 tablet (10 mg total) by mouth daily before breakfast. 30 tablet 5   sacubitril-valsartan (ENTRESTO) 49-51 MG Take 1 tablet by mouth 2 (two) times daily. 60 tablet 3   No current facility-administered medications for this visit.   Vitals:   06/20/23 0955  BP: 117/70  Pulse: 63  Resp: 14  SpO2: 97%  Weight: 203 lb 8 oz (92.3 kg)   Wt Readings from Last 3 Encounters:  06/20/23 203 lb 8 oz (92.3 kg)  05/30/23  204 lb (92.5 kg)  05/09/23 202 lb 8 oz (91.9 kg)   Lab Results  Component Value Date   CREATININE 1.35 (H) 06/20/2023   CREATININE 1.33 (H) 05/09/2023   PHYSICAL EXAM:  General:  Well appearing. No resp difficulty HEENT: normal Neck: supple. JVP flat. No lymphadenopathy or thryomegaly appreciated. Cor: PMI normal. Regular rhythm, rate. No rubs, gallops or murmurs. Lungs: clear Abdomen: soft, nontender, nondistended. No hepatosplenomegaly. No bruits or masses.  Extremities: no cyanosis, clubbing, rash, 1+ pitting edema bilateral lower legs Neuro: alert & oriented x3, cranial nerves grossly intact. Moves all 4 extremities w/o difficulty. Affect pleasant.   ECG: SB with HR 59, frequent PVC's   ASSESSMENT & PLAN:  1:  NICM with reduced ejection fraction- - unclear of etiology but recent stress test was negative for scar/ ischemia - NYHA class I - euvolemic - weighing daily & home weight chart reviewed and shows 197-198 pounds; reminded to call for an overnight weight gain of > 2 pounds or a weekly weight gain of > 5 pounds - weight stable from last visit here 3 weeks ago - Echo 04/11/23: EF 25% - Stress test 04/11/23:   1.  Negative ETT though specificity limited by baseline ECG abnormalities    2.  Severely reduced left ventricular function with estimated LV ejection fraction 20%    3.  Global hypokinesis    4.  No tentative evidence of scar or ischemia  - discussed cMRI but he defers  - not adding salt to his food  - continue entresto 49/51mg  BID (emphasized that he take this BID) - continue carvedilol 3.125mg  BID; HR has been low in the past limiting titration - continue jardiance 10mg  daily - BMP today since entresto started at last visit - wanted to add spiro today but since he's only been taking entresto daily (increasing this today), unsure if BP will allow for it - encouraged (again) to get compression socks and start wearing them daily  2: PVC's- - saw cardiology Anthony Richard) 08/24 - prior holter monitoring in 2019 with 47% PVC burden  - EKG today with SB and PVC's - zio 05/30/23: NSR with numerous runs of VT with max rate of 190 bpm for 4 beats - BMP 05/09/23 showed sodium 139, potassium 4.3, creatinine 1.33 & GFR 55 - BMET today  Return in 2 months, sooner if needed.

## 2023-07-04 ENCOUNTER — Encounter: Payer: Self-pay | Admitting: Internal Medicine

## 2023-07-04 ENCOUNTER — Ambulatory Visit: Payer: Medicare HMO | Attending: Internal Medicine | Admitting: Internal Medicine

## 2023-07-04 VITALS — BP 132/74 | HR 49 | Resp 18 | Wt 205.0 lb

## 2023-07-04 DIAGNOSIS — I472 Ventricular tachycardia, unspecified: Secondary | ICD-10-CM | POA: Insufficient documentation

## 2023-07-04 DIAGNOSIS — Z87891 Personal history of nicotine dependence: Secondary | ICD-10-CM | POA: Diagnosis not present

## 2023-07-04 DIAGNOSIS — I4729 Other ventricular tachycardia: Secondary | ICD-10-CM | POA: Diagnosis not present

## 2023-07-04 DIAGNOSIS — R0683 Snoring: Secondary | ICD-10-CM | POA: Diagnosis not present

## 2023-07-04 DIAGNOSIS — I493 Ventricular premature depolarization: Secondary | ICD-10-CM | POA: Diagnosis not present

## 2023-07-04 DIAGNOSIS — Z79899 Other long term (current) drug therapy: Secondary | ICD-10-CM | POA: Diagnosis not present

## 2023-07-04 DIAGNOSIS — I5022 Chronic systolic (congestive) heart failure: Secondary | ICD-10-CM | POA: Diagnosis not present

## 2023-07-04 DIAGNOSIS — I429 Cardiomyopathy, unspecified: Secondary | ICD-10-CM | POA: Diagnosis not present

## 2023-07-04 DIAGNOSIS — I428 Other cardiomyopathies: Secondary | ICD-10-CM | POA: Diagnosis not present

## 2023-07-04 MED ORDER — FUROSEMIDE 40 MG PO TABS: 40.0000 mg | ORAL_TABLET | Freq: Every day | ORAL | 3 refills | Status: DC

## 2023-07-04 MED ORDER — CARVEDILOL 3.125 MG PO TABS: 3.1250 mg | ORAL_TABLET | Freq: Two times a day (BID) | ORAL | 3 refills | Status: AC

## 2023-07-04 MED ORDER — MEXILETINE HCL 200 MG PO CAPS: 200.0000 mg | ORAL_CAPSULE | Freq: Two times a day (BID) | ORAL | 3 refills | Status: DC

## 2023-07-04 MED ORDER — POTASSIUM CHLORIDE CRYS ER 20 MEQ PO TBCR: 20.0000 meq | EXTENDED_RELEASE_TABLET | Freq: Every day | ORAL | 3 refills | Status: DC

## 2023-07-04 NOTE — Progress Notes (Signed)
ADVANCED HF CLINIC CONSULT NOTE  Referring Physician: Dennison Mascot, MD Primary Care: Dennison Mascot, MD Primary Cardiologist: None  HPI:  Anthony Richard is a 77 y/o male with a history of PVC's, moderate mitral insufficiency, previous tobacco use (quit 2019) and chronic heart failure.    Has not been admitted or been in the ED in the last 6 months.   Myoview 12/19 EF 19% No ischemia or infarct  Echo 8/21 EF 35-40%    Echo 04/11/23: EF 25%   Stress test 04/11/23: 1.  Negative ETT though specificity limited by baseline ECG abnormalities  2.  Severely reduced left ventricular function with estimated LV ejection fraction 20%  3.  Global hypokinesis  4.  No tentative evidence of for scar or ischemia   Zio 10/24  1. Sinus rhythm -  avg HR of 67 bpm. 2. 2266 runs of nonsustained Ventricular Tachycardia runs occurred, the run with the fastest interval lasting 4 beats with a max rate of 190 bpm, the longest lasting 11 beats with an avg rate of 125 bpm.  3. Seven runs of Supraventricular Tachycardia runs occurred, the run with the fastest interval lasting 10.2 secs with a max rate of 169 bpm, the longest lasting 15.3 secs with an avg rate of 113 bpm.  4. Rare PACs  5. Frequent PVCs (7.2%, 96276), couplets (6.4%, 42564), and occasional triplets (1.5%, 6454).  6. Ventricular Bigeminy and Trigeminy were present.  7. No events in diary   He presents today with his wife. Says he feels great. 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 No SOB, orthopnea or PND. Swelling has resolved with cutting back salt. No palpitations. Snores a lot. Wife has to wake him. Witnessed apnea.    At last visit, losartan was stopped and entresto 49/51mg  BID was started although he has only been taking entresto once daily as he forgot that it should be BID. Confirms that he takes the carvedilol BID.     Past Medical History:  Diagnosis Date   CHF (congestive heart failure) (HCC)    PVC  (premature ventricular contraction)     Current Outpatient Medications  Medication Sig Dispense Refill   aspirin EC 81 MG tablet Take 81 mg by mouth daily. Swallow whole.     carvedilol (COREG) 3.125 MG tablet Take 3.125 mg by mouth 2 (two) times daily with a meal.     empagliflozin (JARDIANCE) 10 MG TABS tablet Take 1 tablet (10 mg total) by mouth daily before breakfast. 30 tablet 5   sacubitril-valsartan (ENTRESTO) 49-51 MG Take 1 tablet by mouth 2 (two) times daily. 60 tablet 3   No current facility-administered medications for this visit.    Allergies  Allergen Reactions   Lisinopril Hives   Penicillins Swelling   Latex Rash      Social History   Socioeconomic History   Marital status: Married    Spouse name: Not on file   Number of children: Not on file   Years of education: Not on file   Highest education level: Not on file  Occupational History   Not on file  Tobacco Use   Smoking status: Former    Current packs/day: 0.00    Types: Cigarettes    Quit date: 2019    Years since quitting: 5.8   Smokeless tobacco: Not on file  Substance and Sexual Activity   Alcohol use: No    Alcohol/week: 0.0 standard drinks of alcohol  Drug use: No   Sexual activity: Not on file  Other Topics Concern   Not on file  Social History Narrative   Not on file   Social Determinants of Health   Financial Resource Strain: Not on file  Food Insecurity: Not on file  Transportation Needs: Not on file  Physical Activity: Not on file  Stress: Not on file  Social Connections: Not on file  Intimate Partner Violence: Not on file     History reviewed. No pertinent family history.  Vitals:   07/04/23 1331  BP: 132/74  Pulse: (!) 49  Resp: 18  SpO2: 99%  Weight: 205 lb (93 kg)    PHYSICAL EXAM: General:  Elderly  No respiratory difficulty HEENT: normal Neck: supple. JVP 9 Carotids 2+ bilat; no bruits. No lymphadenopathy or thryomegaly appreciated. Cor: PMI nondisplaced.  Irregular rate & rhythm. No rubs, gallops or murmurs. Lungs: clear Abdomen: soft, nontender, nondistended. No hepatosplenomegaly. No bruits or masses. Good bowel sounds. Extremities: no cyanosis, clubbing, rash, 2+ edema Neuro: alert & oriented x 3, cranial nerves grossly intact. moves all 4 extremities w/o difficulty. Affect pleasant.  ECG: SR 94 with ventricular bigeminy (monomorphic)  IVCD Personally reviewed   ASSESSMENT & PLAN:  1: NICM with reduced ejection fraction- -  Myoview 12/19 EF 19% No ischemia or infarct - Echo 8/21 EF 35-40%   - Echo 04/11/23: EF 25% - suspect PVC cardiomyopathy with progressive LV dysfunction  - Has not had invasive ischemic eval but non-ischemic evaluations have not suggested underlying CAD - Currently NYHA class I-II  - Volume overloaded on exam   - Continue entresto 49/51mg  BID  - Continue carvedilol 3.125mg  BID;  - Continue jardiance 10mg  daily - Add lasix 40 daily + kcl 20 - Add spiro at next visit - Plan cMRI to evaluate for infiltrative disease/sarcoid. If negative consider genetic w/u (?LMNA)   2: PVC/NSVT- - prior holter monitoring in 2019 with 47% PVC burden  - zio 05/30/23: NSR with 2,200 runs NSVT. Frequent PVCs (7.2%, 96276), couplets (6.4%, 42564), and occasional triplets (1.5%, 6454) - Start mexilitene 200 bid - Check sleep study  3. Snoring with witnessed apnea - check home sleep study  .   Arvilla Meres, MD  1:44 PM

## 2023-07-04 NOTE — Progress Notes (Signed)
ITAMAR home sleep study given to patient, all instructions explained, waiver signed, and CLOUDPAT registration complete.  

## 2023-07-04 NOTE — Progress Notes (Signed)
Height: 5'11"    Weight:205 lb BMI:28  Today's Date:07/04/23  STOP BANG RISK ASSESSMENT S (snore) Have you been told that you snore?     YES   T (tired) Are you often tired, fatigued, or sleepy during the day?   NO  O (obstruction) Do you stop breathing, choke, or gasp during sleep? YES   P (pressure) Do you have or are you being treated for high blood pressure? YES   B (BMI) Is your body index greater than 35 kg/m? NO   A (age) Are you 77 years old or older? YES   N (neck) Do you have a neck circumference greater than 16 inches?   NO   G (gender) Are you a male? YES   TOTAL STOP/BANG "YES" ANSWERS 5                                                                       For Office Use Only              Procedure Order Form    YES to 3+ Stop Bang questions OR two clinical symptoms - patient qualifies for WatchPAT (CPT 95800)      Clinical Notes: Will consult Sleep Specialist and refer for management of therapy due to patient increased risk of Sleep Apnea. Ordering a sleep study due to the following two clinical symptoms:  Loud snoring R06.83 / Unrefreshed by sleep G47.8 // History of high blood pressure R03.0

## 2023-07-04 NOTE — Patient Instructions (Addendum)
Medication Changes:  Start taking Lasix 40 mg (1 tablet) daily  Take Potassium 20 mEq (1 tablet) daily  mexiletine (MEXITIL) 200 MG (1 tablet) 2 times a day.     Testing/Procedures:  Your provider has recommended that you have a home sleep study ([facility] Test).  We have provided you with the equipment in our office today. Please go ahead and download the app. DO NOT OPEN OR TAMPER WITH THE BOX UNTIL WE ADVISE YOU TO DO SO. Once insurance has approved the test our office will call you with PIN number and approval to proceed with testing. Once you have completed the test you just dispose of the equipment, the information is automatically uploaded to [place] via blue-tooth technology. If your test is positive for sleep apnea and you need a home CPAP machine you will be contacted by Dr Norris Cross office [facility]) to set this up.   Your physician has requested that you have a cardiac MRI. Cardiac MRI uses a computer to create images of your heart as its beating, producing both still and moving pictures of your heart and major blood vessels. For further information please visit [email or web address]. Please follow the instruction sheet given to you today for more information.  Please call [phone] if you need to reschedule.    Special Instructions // Education:  Do the following things EVERYDAY: Weigh yourself in the morning before breakfast. Write it down and keep it in a log. Take your medicines as prescribed Eat low salt foods--Limit salt (sodium) to 2000 mg per day.  Stay as active as you can everyday Limit all fluids for the day to less than 2 liters   Follow-Up in: please follow up in 4 weeks with Dr. Gala Romney.    If you have any questions or concerns before your next appointment please send [place] a message through [place] or call our office at [phone] Monday-Friday 8 am-5 pm.   If you have an urgent need after hours on the weekend please call your Primary Cardiologist or the  [facility] in [place] at [phone].   At the [facility], you and your health needs are our priority. We have a designated team specialized in the treatment of Heart Failure. This Care Team includes your primary Heart Failure Specialized Cardiologist (physician), Advanced Practice Providers (APPs- Physician Assistants and Nurse Practitioners), and Pharmacist who all work together to provide you with the care you need, when you need it.   You may see any of the following providers on your designated Care Team at your next follow up:  Dr. Arvilla Meres Dr. Marca Ancona Dr. Dorthula Nettles Dr. Theresia Bough Tonye Becket, NP Robbie Lis, [place] [address] [place], [place] Brynda Peon, NP [place] Lee, NP Clarisa Kindred, NP Enos Fling, PharmD

## 2023-07-31 NOTE — Progress Notes (Unsigned)
ADVANCED HF CLINIC CONSULT NOTE  Referring Physician: Dennison Mascot, MD Primary Care: Dennison Mascot, MD Primary Cardiologist: None  HPI:  Anthony Richard is a 77 y/o male with a history of PVC's, moderate mitral insufficiency, previous tobacco use (quit 2019) and chronic heart failure.    Has not been admitted or been in the ED in the last 6 months.   Myoview 12/19 EF 19% No ischemia or infarct  Echo 8/21 EF 35-40%    Echo 04/11/23: EF 25%   Stress test 04/11/23: 1.  Negative ETT though specificity limited by baseline ECG abnormalities  2.  Severely reduced left ventricular function with estimated LV ejection fraction 20%  3.  Global hypokinesis  4.  No tentative evidence of for scar or ischemia   Zio 10/24  1. Sinus rhythm -  avg HR of 67 bpm. 2. 2266 runs of nonsustained Ventricular Tachycardia runs occurred, the run with the fastest interval lasting 4 beats with a max rate of 190 bpm, the longest lasting 11 beats with an avg rate of 125 bpm.  3. Seven runs of Supraventricular Tachycardia runs occurred, the run with the fastest interval lasting 10.2 secs with a max rate of 169 bpm, the longest lasting 15.3 secs with an avg rate of 113 bpm.  4. Rare PACs  5. Frequent PVCs (7.2%, 96276), couplets (6.4%, 42564), and occasional triplets (1.5%, 6454).  6. Ventricular Bigeminy and Trigeminy were present.  7. No events in diary  At last visit started on lasix 40 daily. Entresto increased from daily to bid. Mexilitene started. cMRI and sleep study ordered but pending   He presents today with his wife. Says he feels great. 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 Feels ok. Denies SOB, CP, edema, orthopnea or PND     Past Medical History:  Diagnosis Date   CHF (congestive heart failure) (HCC)    PVC (premature ventricular contraction)     Current Outpatient Medications  Medication Sig Dispense Refill   aspirin EC 81 MG tablet Take 81 mg by  mouth daily. Swallow whole.     carvedilol (COREG) 3.125 MG tablet Take 1 tablet (3.125 mg total) by mouth 2 (two) times daily with a meal. 90 tablet 3   empagliflozin (JARDIANCE) 10 MG TABS tablet Take 1 tablet (10 mg total) by mouth daily before breakfast. 30 tablet 5   furosemide (LASIX) 40 MG tablet Take 1 tablet (40 mg total) by mouth daily. 90 tablet 3   mexiletine (MEXITIL) 200 MG capsule Take 1 capsule (200 mg total) by mouth 2 (two) times daily. 180 capsule 3   potassium chloride SA (KLOR-CON M) 20 MEQ tablet Take 1 tablet (20 mEq total) by mouth daily. 90 tablet 3   sacubitril-valsartan (ENTRESTO) 49-51 MG Take 1 tablet by mouth 2 (two) times daily. 60 tablet 3   No current facility-administered medications for this visit.    Allergies  Allergen Reactions   Lisinopril Hives   Penicillins Swelling   Latex Rash      Social History   Socioeconomic History   Marital status: Married    Spouse name: Not on file   Number of children: Not on file   Years of education: Not on file   Highest education level: Not on file  Occupational History   Not on file  Tobacco Use   Smoking status: Former    Current packs/day: 0.00    Types: Cigarettes  Quit date: 2019    Years since quitting: 5.9   Smokeless tobacco: Not on file  Substance and Sexual Activity   Alcohol use: No    Alcohol/week: 0.0 standard drinks of alcohol   Drug use: No   Sexual activity: Not on file  Other Topics Concern   Not on file  Social History Narrative   Not on file   Social Determinants of Health   Financial Resource Strain: Not on file  Food Insecurity: Not on file  Transportation Needs: Not on file  Physical Activity: Not on file  Stress: Not on file  Social Connections: Not on file  Intimate Partner Violence: Not on file     No family history on file.  Vitals:   08/01/23 1409  BP: 117/76  Pulse: 60  SpO2: 100%  Weight: 202 lb (91.6 kg)   Wt Readings from Last 3 Encounters:   08/01/23 202 lb (91.6 kg)  07/04/23 205 lb (93 kg)  06/20/23 203 lb 8 oz (92.3 kg)     PHYSICAL EXAM: General:  Elderly  No respiratory difficulty HEENT: normal Neck: supple. no JVD. Carotids 2+ bilat; no bruits. No lymphadenopathy or thryomegaly appreciated. Cor: PMI nondisplaced. Irregular rate & rhythm. No rubs, gallops or murmurs. Lungs: clear Abdomen: soft, nontender, nondistended. No hepatosplenomegaly. No bruits or masses. Good bowel sounds. Extremities: no cyanosis, rash, trace edema. + clubbing Neuro: alert & orientedx3, cranial nerves grossly intact. moves all 4 extremities w/o difficulty. Affect pleasant    ASSESSMENT & PLAN:  1: NICM with reduced ejection fraction- -  Myoview 12/19 EF 19% No ischemia or infarct - Echo 8/21 EF 35-40%   - Echo 04/11/23: EF 25% - suspect PVC cardiomyopathy with progressive LV dysfunction  - Has not had invasive ischemic eval but non-ischemic evaluations have not suggested underlying CAD - Improved NYHA II  - Volume status improved since last visit  - Continue entresto 49/51mg  BID  - Continue carvedilol 3.125mg  BID;  - Continue jardiance 10mg  daily - Continue lasix 40 daily - Stop KCl - Start spiro 12.5 daily  - Plan cMRI to evaluate for infiltrative disease/sarcoid. If negative consider genetic w/u (?LMNA). Study authorized with insurance today    2: PVC/NSVT- - prior holter monitoring in 2019 with 47% PVC burden  - zio 05/30/23: NSR with 2,200 runs NSVT. Frequent PVCs (7.2%, 96276), couplets (6.4%, 42564), and occasional triplets (1.5%, 6454) - Mexilitene 200 bid started at last visit. Not sure if he is taking. We called Pharmacy and he has not picked it up. We worked with PharmD to make sure it was affordable and he will go pick it up   - Check sleep study  3. Snoring with witnessed apnea and clubbing on exam  - home sleep study still pending. Encouraged him to do this today  I spent a total of 45 minutes today: 1) reviewing  the patient's medical records including previous charts, labs and recent notes from other providers; 2) examining the patient and counseling them on their medical issues/explaining the plan of care; 3) adjusting meds as needed and 4) ordering lab work or other needed tests.     Anthony Meres, MD  2:29 PM

## 2023-08-01 ENCOUNTER — Ambulatory Visit: Payer: Medicare HMO | Attending: Internal Medicine | Admitting: Internal Medicine

## 2023-08-01 ENCOUNTER — Other Ambulatory Visit (HOSPITAL_COMMUNITY): Payer: Self-pay

## 2023-08-01 ENCOUNTER — Telehealth: Payer: Self-pay | Admitting: *Deleted

## 2023-08-01 VITALS — BP 117/76 | HR 60 | Wt 202.0 lb

## 2023-08-01 DIAGNOSIS — I493 Ventricular premature depolarization: Secondary | ICD-10-CM

## 2023-08-01 DIAGNOSIS — I5022 Chronic systolic (congestive) heart failure: Secondary | ICD-10-CM

## 2023-08-01 DIAGNOSIS — R0683 Snoring: Secondary | ICD-10-CM | POA: Diagnosis not present

## 2023-08-01 MED ORDER — SPIRONOLACTONE 25 MG PO TABS: 12.5000 mg | ORAL_TABLET | Freq: Every day | ORAL | 3 refills | Status: DC

## 2023-08-01 MED ORDER — MEXILETINE HCL 200 MG PO CAPS: 200.0000 mg | ORAL_CAPSULE | Freq: Two times a day (BID) | ORAL | 3 refills | Status: DC

## 2023-08-01 NOTE — Telephone Encounter (Signed)
Authorization #[id number]  [employer] auth expires 09/16/23

## 2023-08-01 NOTE — Patient Instructions (Addendum)
STOP Potassium   START Mexilitene 200mg  twice daily  START Spironolactone 12.5mg  daily  Routine lab work today. Will notify you of abnormal results  Repeat labs in 2 weeks  Do the following things EVERYDAY: Weigh yourself in the morning before breakfast. Write it down and keep it in a log. Take your medicines as prescribed Eat low salt foods--Limit salt (sodium) to 2000 mg per day.  Stay as active as you can everyday Limit all fluids for the day to less than 2 liters  Follow up in 2 months with Dr.Bensimhon

## 2023-08-02 LAB — BASIC METABOLIC PANEL
BUN/Creatinine Ratio: 15 (ref 10–24)
BUN: 25 mg/dL (ref 8–27)
CO2: 22 mmol/L (ref 20–29)
Calcium: 9.4 mg/dL (ref 8.6–10.2)
Chloride: 106 mmol/L (ref 96–106)
Creatinine, Ser: 1.64 mg/dL — ABNORMAL HIGH (ref 0.76–1.27)
Glucose: 85 mg/dL (ref 70–99)
Potassium: 5.7 mmol/L — ABNORMAL HIGH (ref 3.5–5.2)
Sodium: 141 mmol/L (ref 134–144)
eGFR: 43 mL/min/{1.73_m2} — ABNORMAL LOW (ref >59)

## 2023-08-07 ENCOUNTER — Encounter (INDEPENDENT_AMBULATORY_CARE_PROVIDER_SITE_OTHER): Payer: Medicare HMO | Admitting: Cardiology

## 2023-08-07 DIAGNOSIS — G4733 Obstructive sleep apnea (adult) (pediatric): Secondary | ICD-10-CM

## 2023-08-08 DIAGNOSIS — I493 Ventricular premature depolarization: Secondary | ICD-10-CM | POA: Diagnosis not present

## 2023-08-08 DIAGNOSIS — I5022 Chronic systolic (congestive) heart failure: Secondary | ICD-10-CM | POA: Diagnosis not present

## 2023-08-08 DIAGNOSIS — R7303 Prediabetes: Secondary | ICD-10-CM | POA: Diagnosis not present

## 2023-08-08 DIAGNOSIS — I059 Rheumatic mitral valve disease, unspecified: Secondary | ICD-10-CM | POA: Diagnosis not present

## 2023-08-08 DIAGNOSIS — I429 Cardiomyopathy, unspecified: Secondary | ICD-10-CM | POA: Diagnosis not present

## 2023-08-15 ENCOUNTER — Other Ambulatory Visit (HOSPITAL_COMMUNITY): Payer: Self-pay

## 2023-08-15 ENCOUNTER — Ambulatory Visit (HOSPITAL_BASED_OUTPATIENT_CLINIC_OR_DEPARTMENT_OTHER): Payer: Medicare HMO | Admitting: Family

## 2023-08-15 ENCOUNTER — Encounter: Payer: Self-pay | Admitting: Family

## 2023-08-15 ENCOUNTER — Other Ambulatory Visit
Admission: RE | Admit: 2023-08-15 | Discharge: 2023-08-15 | Disposition: A | Discharge status: Home / Self Care | Payer: Medicare HMO | Source: Ambulatory Visit | Attending: Family | Admitting: Family

## 2023-08-15 VITALS — BP 105/70 | HR 52 | Wt 201.0 lb

## 2023-08-15 DIAGNOSIS — R0683 Snoring: Secondary | ICD-10-CM

## 2023-08-15 DIAGNOSIS — I5022 Chronic systolic (congestive) heart failure: Secondary | ICD-10-CM | POA: Insufficient documentation

## 2023-08-15 DIAGNOSIS — I493 Ventricular premature depolarization: Secondary | ICD-10-CM | POA: Diagnosis not present

## 2023-08-15 LAB — BASIC METABOLIC PANEL
Anion gap: 8 (ref 5–15)
BUN: 30 mg/dL — ABNORMAL HIGH (ref 8–23)
CO2: 25 mmol/L (ref 22–32)
Calcium: 9.3 mg/dL (ref 8.9–10.3)
Chloride: 106 mmol/L (ref 98–111)
Creatinine, Ser: 1.67 mg/dL — ABNORMAL HIGH (ref 0.61–1.24)
GFR, Estimated: 42 mL/min — ABNORMAL LOW (ref >60)
Glucose, Bld: 87 mg/dL (ref 70–99)
Potassium: 5.3 mmol/L — ABNORMAL HIGH (ref 3.5–5.1)
Sodium: 139 mmol/L (ref 135–145)

## 2023-08-15 MED ORDER — MEXILETINE HCL 200 MG PO CAPS: 200.0000 mg | ORAL_CAPSULE | Freq: Two times a day (BID) | ORAL | 11 refills | Status: DC

## 2023-08-15 NOTE — Progress Notes (Signed)
ADVANCED HF CLINIC CONSULT NOTE   Primary Care: Dennison Mascot, MD Primary Cardiologist: Dorothyann Peng, MD/ Fermin Schwab, [place] (last seen 07/24) HF provider: Arvilla Meres, MD (last seen 12/24)  HPI:  Anthony Richard is a 77 y/o male with a history of PVC's, moderate mitral insufficiency, previous tobacco use (quit 2019) and chronic heart failure. Previously referred by his general cardiologist due to needing HF clinic care on Mondays when he is off work   Has not been admitted or been in the ED in the last 6 months.   Myoview 12/19 EF 19% No ischemia or infarct  Echo 8/21 EF 35-40%    Echo 04/11/23: EF 25%   Stress test 04/11/23: 1.  Negative ETT though specificity limited by baseline ECG abnormalities  2.  Severely reduced left ventricular function with estimated LV ejection fraction 20%  3.  Global hypokinesis  4.  No tentative evidence of for scar or ischemia   Zio 10/24  1. Sinus rhythm -  avg HR of 67 bpm. 2. 2266 runs of nonsustained Ventricular Tachycardia runs occurred, the run with the fastest interval lasting 4 beats with a max rate of 190 bpm, the longest lasting 11 beats with an avg rate of 125 bpm.  3. Seven runs of Supraventricular Tachycardia runs occurred, the run with the fastest interval lasting 10.2 secs with a max rate of 169 bpm, the longest lasting 15.3 secs with an avg rate of 113 bpm.  4. Rare PACs  5. Frequent PVCs (7.2%, 96276), couplets (6.4%, 42564), and occasional triplets (1.5%, 6454).  6. Ventricular Bigeminy and Trigeminy were present.  7. No events in diary  He presents today for a HF follow-up visit with a chief complaint of minimal fatigue with moderate exertion. Chronic in nature. Has associated pedal edema and snoring along with this. Reports sleeping well on 1 pillow. Denies shortness of breath, chest pain, cough, palpitations, abdominal distention, pedal edema, dizziness or weight gain.   At last visit, spironolactone 12.5mg  daily was  started but because the pill was too small to cut, he's been taking 25mg  daily. Per his medication bottles he brought, he's been taking entresto AND losartan because his pharmacy filled his losartan so he resumed it. He also hasn't been taking mexiletine because he says that he's waiting on his pharmacy to let him know when it's ready.   Did return his home sleep study but still waiting on results.    Past Medical History:  Diagnosis Date   CHF (congestive heart failure) (HCC)    PVC (premature ventricular contraction)     Current Outpatient Medications  Medication Sig Dispense Refill   aspirin EC 81 MG tablet Take 81 mg by mouth daily. Swallow whole.     carvedilol (COREG) 3.125 MG tablet Take 1 tablet (3.125 mg total) by mouth 2 (two) times daily with a meal. 90 tablet 3   empagliflozin (JARDIANCE) 10 MG TABS tablet Take 1 tablet (10 mg total) by mouth daily before breakfast. 30 tablet 5   furosemide (LASIX) 40 MG tablet Take 1 tablet (40 mg total) by mouth daily. 90 tablet 3   mexiletine (MEXITIL) 200 MG capsule Take 1 capsule (200 mg total) by mouth 2 (two) times daily. 60 capsule 3   sacubitril-valsartan (ENTRESTO) 49-51 MG Take 1 tablet by mouth 2 (two) times daily. 60 tablet 3   spironolactone (ALDACTONE) 25 MG tablet Take 0.5 tablets (12.5 mg total) by mouth daily. 45 tablet 3   No current facility-administered  medications for this visit.    Allergies  Allergen Reactions   Lisinopril Hives   Penicillins Swelling   Latex Rash      Social History   Socioeconomic History   Marital status: Married    Spouse name: Not on file   Number of children: Not on file   Years of education: Not on file   Highest education level: Not on file  Occupational History   Not on file  Tobacco Use   Smoking status: Former    Current packs/day: 0.00    Types: Cigarettes    Quit date: 2019    Years since quitting: 5.9   Smokeless tobacco: Not on file  Substance and Sexual Activity    Alcohol use: No    Alcohol/week: 0.0 standard drinks of alcohol   Drug use: No   Sexual activity: Not on file  Other Topics Concern   Not on file  Social History Narrative   Not on file   Social Drivers of Health   Financial Resource Strain: Not on file  Food Insecurity: Not on file  Transportation Needs: Not on file  Physical Activity: Not on file  Stress: Not on file  Social Connections: Not on file  Intimate Partner Violence: Not on file     No family history on file.  Vitals:   08/15/23 1101 08/15/23 1105  BP:  105/70  Pulse: (!) 48 (!) 52  SpO2: 99% 100%  Weight: 201 lb (91.2 kg)    Wt Readings from Last 3 Encounters:  08/15/23 201 lb (91.2 kg)  08/01/23 202 lb (91.6 kg)  07/04/23 205 lb (93 kg)   Lab Results  Component Value Date   CREATININE 1.67 (H) 08/15/2023   CREATININE 1.64 (H) 08/01/2023   CREATININE 1.35 (H) 06/20/2023    PHYSICAL EXAM: General:  Elderly  No respiratory difficulty HEENT: normal Neck: supple. no JVD. Carotids 2+ bilat; no bruits. No lymphadenopathy or thryomegaly appreciated. Cor: PMI nondisplaced. Irregular rhythm, bradycardic. No rubs, gallops or murmurs. Lungs: clear Abdomen: soft, nontender, nondistended. No hepatosplenomegaly. No bruits or masses.  Extremities: no cyanosis, rash, trace edema. + clubbing Neuro: alert & oriented x3, cranial nerves grossly intact. moves all 4 extremities w/o difficulty. 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 If negative consider genetic w/u (?LMNA).   2: PVC/NSVT- - prior holter monitoring in 2019 with 47% PVC burden  - zio 05/30/23: NSR with 2,200 runs NSVT. Frequent PVCs (7.2%, 96276), couplets (6.4%, 42564), and occasional triplets (1.5%, 6454) - start mexiletine 200mg  BID - sleep study done but results still pending  3. Snoring with witnessed apnea and clubbing on exam-  - home sleep study results pending  Return in 1 month, sooner if needed.

## 2023-08-15 NOTE — Patient Instructions (Addendum)
Start back taking your Mexiletine. We sent a new prescription for you to your pharmacy.  When you go to the pharmacy to pick up the mexilitine, use good RX and NOT your insurance.  Go over to the [facility]. Go pass the gift shop and have your blood work completed.  We will only call you if the results are abnormal or if the provider would like to make medication changes.  SOMEONE WILL CALL YOU TO GET [facility] FOR YOUR CARDIAC MRI  [facility] [address] [place], [place] [address] [phone] Please go to the [facility] and check-in with the desk attendant.   Magnetic resonance imaging (MRI) is a painless test that produces images of the inside of the body without using Xrays.  During an MRI, strong magnets and radio waves work together in a Data processing manager to form detailed images.   MRI images may provide more details about a medical condition than X-rays, CT scans, and ultrasounds can provide.  You may be given earphones to listen for instructions.  You may eat a light breakfast and take medications as ordered with the exception of furosemide, hydrochlorothiazide, chlorthalidone or spironolactone (or any other fluid pill). If you are undergoing a stress MRI, please avoid stimulants for 12 hr prior to test. (I.e. Caffeine, nicotine, chocolate, or antihistamine medications)  An IV will be inserted into one of your veins. Contrast material will be injected into your IV. It will leave your body through your urine within a day. You may be told to drink plenty of fluids to help flush the contrast material out of your system.  You will be asked to remove all metal, including: Watch, jewelry, and other metal objects including hearing aids, hair pieces and dentures. Also wearable glucose monitoring systems (ie. Freestyle Libre and Omnipods) (Braces and fillings normally are not a problem.)   TEST WILL TAKE APPROXIMATELY 1 HOUR  PLEASE NOTIFY  SCHEDULING AT LEAST 24 HOURS IN ADVANCE IF YOU ARE UNABLE TO KEEP YOUR APPOINTMENT. [phone]  For more information and frequently asked questions, please visit our website : [email or web address]  Please call the Cardiac Imaging Nurse Navigators with any questions/concerns. [phone] Office

## 2023-08-16 ENCOUNTER — Encounter: Payer: Medicare HMO | Admitting: Family

## 2023-08-23 ENCOUNTER — Ambulatory Visit: Payer: Medicare HMO | Attending: Internal Medicine

## 2023-08-23 DIAGNOSIS — I5022 Chronic systolic (congestive) heart failure: Secondary | ICD-10-CM

## 2023-08-23 NOTE — Procedures (Signed)
SLEEP STUDY REPORT Patient Information Study Date: 08/07/2023 Patient Name: Anthony Richard Patient ID: [id number] Birth Date: Apr 20, 1946 Age: 77 Gender: Male BMI: 28.7 (W=205 lb, H=5' 11'') Stopbang: 5 Referring Physician: Arvilla Meres, MD  TEST DESCRIPTION: Home sleep apnea testing was completed using the WatchPat, a Type 1 device, utilizing  peripheral arterial tonometry (PAT), chest movement, actigraphy, pulse oximetry, pulse rate, body position and snore.  AHI was calculated with apnea and hypopnea using valid sleep time as the denominator. RDI includes apneas,  hypopneas, and RERAs. The data acquired and the scoring of sleep and all associated events were performed in  accordance with the recommended standards and specifications as outlined in the AASM Manual for the Scoring of  Sleep and Associated Events 2.2.0 (2015).  FINDINGS:  1. Moderate Obstructive Sleep Apnea with AHI 16.8/hr.   2. No Central Sleep Apnea with pAHIc 3/hr.  3. Oxygen desaturations as low as 86%.  4. Moderate snoring was present. O2 sats were < 88% for 0.7 min.  5. Total sleep time was 7 hrs and 11 min.  6. 20.3% of total sleep time was spent in REM sleep.   7. Shortened sleep onset latency at 6 min  8. Prolonged REM sleep onset latency at 204 min.   9. Total awakenings were 35.  10. Arrhythmia detection: Suggestive of possible brief atrial fibrillation lasting 40 min and 58 seconds. This is not  diagnostic and further testing with outpatient telemetry monitoring is recommended.  DIAGNOSIS:  Moderate Obstructive Sleep Apnea (G47.33) Possible Atrial Fibrillation  RECOMMENDATIONS: 1. Clinical correlation of these findings is necessary. The decision to treat obstructive sleep apnea (OSA) is usually  based on the presence of apnea symptoms or the presence of associated medical conditions such as Hypertension,  Congestive Heart Failure, Atrial Fibrillation or Obesity. The most common symptoms of OSA  are snoring, gasping for  breath while sleeping, daytime sleepiness and fatigue.   2. Initiating apnea therapy is recommended given the presence of symptoms and/or associated conditions.  Recommend proceeding with one of the following:   a. Auto-CPAP therapy with a pressure range of 5-20cm H2O.   b. An oral appliance (OA) that can be obtained from certain dentists with expertise in sleep medicine. These are  primarily of use in non-obese patients with mild and moderate disease.   c. An ENT consultation which may be useful to look for specific causes of obstruction and possible treatment  options.   d. If patient is intolerant to PAP therapy, consider referral to ENT for evaluation for hypoglossal nerve stimulator.   3. Close follow-up is necessary to ensure success with CPAP or oral appliance therapy for maximum benefit .  4. A follow-up oximetry study on CPAP is recommended to assess the adequacy of therapy and determine the need  for supplemental oxygen or the potential need for Bi-level therapy. An arterial blood gas to determine the adequacy of  baseline ventilation and oxygenation should also be considered.  5. Healthy sleep recommendations include: adequate nightly sleep (normal 7-9 hrs/night), avoidance of caffeine after  noon and alcohol near bedtime, and maintaining a sleep environment that is cool, dark and quiet.  6. Weight loss for overweight patients is recommended. Even modest amounts of weight loss can significantly  improve the severity of sleep apnea.  7. Snoring recommendations include: weight loss where appropriate, side sleeping, and avoidance of alcohol before  bed.  8. Operation of motor vehicle should not be performed when sleepy.  Signature: Tasman Zapata  Mayford Knife, MD; [facility]; Diplomat, American Board of Sleep  Medicine Electronically Signed: 08/23/2023 5:32:32 PM

## 2023-09-03 ENCOUNTER — Other Ambulatory Visit: Payer: Self-pay | Admitting: Family

## 2023-09-07 ENCOUNTER — Telehealth: Payer: Self-pay

## 2023-09-07 NOTE — Telephone Encounter (Signed)
 Patient is not in network with [facility], Order for CPAP machine and supplies has been sent to [facility] today.

## 2023-10-01 DIAGNOSIS — M9903 Segmental and somatic dysfunction of lumbar region: Secondary | ICD-10-CM | POA: Diagnosis not present

## 2023-10-01 DIAGNOSIS — M41127 Adolescent idiopathic scoliosis, lumbosacral region: Secondary | ICD-10-CM | POA: Diagnosis not present

## 2023-10-01 DIAGNOSIS — M62830 Muscle spasm of back: Secondary | ICD-10-CM | POA: Diagnosis not present

## 2023-10-01 DIAGNOSIS — M5416 Radiculopathy, lumbar region: Secondary | ICD-10-CM | POA: Diagnosis not present

## 2023-10-02 ENCOUNTER — Emergency Department
Admission: EM | Admit: 2023-10-02 | Discharge: 2023-10-02 | Disposition: A | Discharge status: Home / Self Care | Payer: Worker's Compensation | Attending: Emergency Medicine | Admitting: Emergency Medicine

## 2023-10-02 ENCOUNTER — Emergency Department: Payer: Worker's Compensation

## 2023-10-02 ENCOUNTER — Encounter: Payer: Self-pay | Admitting: Intensive Care

## 2023-10-02 ENCOUNTER — Other Ambulatory Visit: Payer: Self-pay

## 2023-10-02 DIAGNOSIS — I509 Heart failure, unspecified: Secondary | ICD-10-CM | POA: Insufficient documentation

## 2023-10-02 DIAGNOSIS — T148XXA Other injury of unspecified body region, initial encounter: Secondary | ICD-10-CM

## 2023-10-02 DIAGNOSIS — M25552 Pain in left hip: Secondary | ICD-10-CM | POA: Insufficient documentation

## 2023-10-02 DIAGNOSIS — M545 Low back pain, unspecified: Secondary | ICD-10-CM | POA: Diagnosis not present

## 2023-10-02 MED ORDER — PREDNISONE 10 MG PO TABS: 10.0000 mg | ORAL_TABLET | Freq: Every day | ORAL | 0 refills | Status: DC

## 2023-10-02 MED ORDER — MELOXICAM 15 MG PO TABS: 15.0000 mg | ORAL_TABLET | Freq: Every day | ORAL | 0 refills | Status: AC

## 2023-10-02 NOTE — Discharge Instructions (Addendum)
Your evaluated in the ED for left hip pain.  Your x-rays of your lower back and left hip are normal.  Your physical exam findings are reassuring.  Please alternate taking Tylenol and ibuprofen for pain as needed.  Continue applying ice and heat over the affected area for optimal healing.  Perform range of motion exercises at home.  Follow-up with your primary care provider.

## 2023-10-02 NOTE — ED Provider Notes (Signed)
[facility] Emergency Department Provider Note     Event Date/Time   First MD Initiated Contact with Patient 10/02/23 1520     (approximate)   History   Back Pain   HPI  Anthony Richard is a 78 y.o. male with a history of CHF presents to the ED for evaluation of left lower back pain with radiation down to left thigh x 3 days.  Intermittent in nature.  Described as sharp. No urinary symptoms.  Patient reports he was at work when he picked up a heavy box when he noticed this pain.  He is requesting for work stop.  Patient reports he was seen at [facility] yesterday due to the company referring him he was given a medication that he cannot recall and states the pharmacist would not refill this medication due to it contradicting with his cardiac history.  Patient reports he has an appointment with [facility] tomorrow.  He has tried ibuprofen and Tylenol, heat and ice with minimal relief. Denies urinary symptoms.      Physical Exam   Triage Vital Signs: ED Triage Vitals  Encounter Vitals Group     BP 10/02/23 1427 135/76     Systolic BP Percentile --      Diastolic BP Percentile --      Pulse Rate 10/02/23 1427 (!) 104     Resp 10/02/23 1427 18     Temp 10/02/23 1427 98.2 F (36.8 C)     Temp Source 10/02/23 1427 Oral     SpO2 10/02/23 1427 97 %     Weight 10/02/23 1428 198 lb (89.8 kg)     Height 10/02/23 1428 5\' 11"  (1.803 m)     Head Circumference --      Peak Flow --      Pain Score 10/02/23 1428 10     Pain Loc --      Pain Education --      Exclude from Growth Chart --     Most recent vital signs: Vitals:   10/02/23 1427  BP: 135/76  Pulse: (!) 104  Resp: 18  Temp: 98.2 F (36.8 C)  SpO2: 97%    General: Well appearing. Alert and oriented. INAD.  Skin:  Warm, dry and intact. No rashes or lesions noted.     Head:  NCAT.  Eyes:  PERRLA. EOMI.  CV:  Good peripheral perfusion. RRR.  RESP:  Normal effort. LCTAB. No retractions.  ABD:  No  distention. Soft, Non tender.  BACK:  Spinous process is midline without deformity or tenderness. (-) SLRT bilaterally MSK:   Full ROM in all joints. No swelling, deformity or tenderness.  Tenderness to palpation over IT band. NEURO: Cranial nerves II-XII intact. No focal deficits. Sensation and motor function intact. 5/5 muscle strength of UE & LE. Gait is steady.    ED Results / Procedures / Treatments   Labs (all labs ordered are listed, but only abnormal results are displayed) Labs Reviewed - No data to display  RADIOLOGY  I personally viewed and evaluated these images as part of my medical decision making, as well as reviewing the written report by the radiologist.  ED Provider Interpretation: No acute abnormalities of the left hip or lumbar spine.  DG Hip Unilat With Pelvis 2-3 Views Left Result Date: 10/02/2023 CLINICAL DATA:  Pain for 3 days rating down the left leg. EXAM: DG HIP (WITH OR WITHOUT PELVIS) 2-3V LEFT COMPARISON:  None Available. FINDINGS:  There may be medial joint space narrowing in the hips bilaterally. Mild collar osteophytosis along the left femoral head. Sacroiliac joints are patent. Degenerative changes in the lumbar spine. IMPRESSION: 1. No acute findings. 2. Suspect mild medial joint space narrowing in the hips bilaterally. Associated collar osteophytosis along the left femoral head, indicative of degenerative disease. Electronically Signed   By: Leanna Battles M.D.   On: 10/02/2023 15:05   DG Lumbar Spine Complete Result Date: 10/02/2023 CLINICAL DATA:  Pain for 3 days rating down the left leg. EXAM: LUMBAR SPINE - COMPLETE 4+ VIEW COMPARISON:  None Available. FINDINGS: Mild levoconvex curvature. Alignment is otherwise anatomic. Vertebral body heights are maintained. Multilevel endplate degenerative changes. Loss of disc space height at L1-2 and L5-S1. Facet hypertrophy in the mid and lower lumbar spine. No definite pars defects. IMPRESSION: 1. No acute findings. 2.  Multilevel degenerative disc disease. Electronically Signed   By: Leanna Battles M.D.   On: 10/02/2023 15:03    PROCEDURES:  Critical Care performed: No  Procedures   MEDICATIONS ORDERED IN ED: Medications - No data to display   IMPRESSION / MDM / ASSESSMENT AND PLAN / ED COURSE  I reviewed the triage vital signs and the nursing notes.                               78 y.o. male presents to the emergency department for evaluation and treatment of left hip pain. See HPI for further details.   Differential diagnosis includes, but is not limited to muscle strain, sciatica, IT band syndrome  Patient's presentation is most consistent with acute complicated illness / injury requiring diagnostic workup.  Patient is alert and oriented.  He is initially mildly tachy at 104.  Physical exam findings are overall benign.  No back red flag signs.  Physical exam findings are as stated above and overall benign.  Left hip x-ray and lumbar spine x-ray are reassuring.  Patient is advised to alternate taking Tylenol and meloxicam as needed.  I do believe he is in stable condition for discharge home and outpatient follow-up.  ED return precaution discussed.  FINAL CLINICAL IMPRESSION(S) / ED DIAGNOSES   Final diagnoses:  Muscle strain  Left hip pain   Rx / DC Orders   ED Discharge Orders          Ordered    predniSONE (DELTASONE) 10 MG tablet  Daily        10/02/23 1626    meloxicam (MOBIC) 15 MG tablet  Daily        10/02/23 1643            Note:  This document was prepared using Dragon voice recognition software and may include unintentional dictation errors.    Romeo Apple, Keleigh Kazee A, PA-C 10/02/23 Marco Collie, MD 10/02/23 [phone]

## 2023-10-02 NOTE — ED Provider Triage Note (Signed)
Emergency Medicine Provider Triage Evaluation Note  Anthony Richard , a 78 y.o. male  was evaluated in triage.  Pt complains of left sided leg pain thaqt radiates down the side of left leg to his thigh.  Patient reports that this began on Thursday after he picked something up wrong.  He denies abdominal pain, dysuria, or hematuria.  He is able to walk.  He denies weakness in his leg.  Review of Systems  Positive: Leg pain Negative: Abdominal pain, urinary symptoms, weakness  Physical Exam  BP 135/76 (BP Location: Left Arm)   Pulse (!) 104   Temp 98.2 F (36.8 C) (Oral)   Resp 18   Ht 5\' 11"  (1.803 m)   Wt 89.8 kg   SpO2 97%   BMI 27.62 kg/m  Gen:   Awake, no distress   Resp:  Normal effort  MSK:   Moves extremities without difficulty  Other:    Medical Decision Making  Medically screening exam initiated at 2:29 PM.  Appropriate orders placed.  Anthony Richard was informed that the remainder of the evaluation will be completed by another provider, this initial triage assessment does not replace that evaluation, and the importance of remaining in the ED until their evaluation is complete.     Anthony Hoehn, PA-C 10/02/23 1430

## 2023-10-02 NOTE — ED Triage Notes (Addendum)
Patient presents with left side pain that radiates down to thigh. Pain started Thursday. Patient wants to file [facility] comp

## 2023-10-13 ENCOUNTER — Telehealth: Payer: Self-pay | Admitting: Internal Medicine

## 2023-10-13 NOTE — Telephone Encounter (Signed)
Lvm to confirm appt for 10/14/23

## 2023-10-14 ENCOUNTER — Ambulatory Visit: Payer: Medicare HMO | Attending: Internal Medicine | Admitting: Internal Medicine

## 2023-10-14 VITALS — BP 130/80 | HR 51 | Wt 198.0 lb

## 2023-10-14 DIAGNOSIS — R9431 Abnormal electrocardiogram [ECG] [EKG]: Secondary | ICD-10-CM | POA: Diagnosis not present

## 2023-10-14 DIAGNOSIS — I340 Nonrheumatic mitral (valve) insufficiency: Secondary | ICD-10-CM | POA: Diagnosis not present

## 2023-10-14 DIAGNOSIS — I5022 Chronic systolic (congestive) heart failure: Secondary | ICD-10-CM | POA: Diagnosis not present

## 2023-10-14 DIAGNOSIS — G4733 Obstructive sleep apnea (adult) (pediatric): Secondary | ICD-10-CM

## 2023-10-14 DIAGNOSIS — E875 Hyperkalemia: Secondary | ICD-10-CM

## 2023-10-14 DIAGNOSIS — I493 Ventricular premature depolarization: Secondary | ICD-10-CM

## 2023-10-14 DIAGNOSIS — Z87891 Personal history of nicotine dependence: Secondary | ICD-10-CM | POA: Diagnosis not present

## 2023-10-14 DIAGNOSIS — I509 Heart failure, unspecified: Secondary | ICD-10-CM | POA: Diagnosis not present

## 2023-10-14 MED ORDER — EMPAGLIFLOZIN 10 MG PO TABS: 10.0000 mg | ORAL_TABLET | Freq: Every day | ORAL | 11 refills | Status: AC

## 2023-10-14 NOTE — Patient Instructions (Addendum)
Go DOWN to LOWER LEVEL (LL) to have your blood work completed inside of [employer] office.  We will only call you if the results are abnormal or if the provider would like to make medication changes.  Our Doctors' schedules are NOT open yet for 2 months. We will place you on our recall list. Once they are available, we will call you to schedule your follow up appointment.

## 2023-10-14 NOTE — Progress Notes (Signed)
[facility]ANCED [facility] CL[facility]IC CONSULT NOTE  Referring Physician: Ardyth Man, PA-C Primary Care: Ardyth Man, PA-C Primary Cardiologist: None  HPI:  Anthony Richard is a 78 y/o male with a history of PVC's, moderate mitral insufficiency, previous tobacco use (quit 2019) and chronic heart failure.    Has not been admitted or been in the ED in the last 6 months.   Myoview 12/19 EF 19% No ischemia or infarct  Echo 8/21 EF 35-40%    Echo 04/11/23: EF 25%   Stress test 04/11/23: 1.  Negative ETT though specificity limited by baseline ECG abnormalities  2.  Severely reduced left ventricular function with estimated LV ejection fraction 20%  3.  Global hypokinesis  4.  No tentative evidence of for scar or ischemia   Zio 10/24  1. Sinus rhythm -  avg HR of 67 bpm. 2. 2266 runs of nonsustained Ventricular Tachycardia runs occurred, the run with the fastest interval lasting 4 beats with a max rate of 190 bpm, the longest lasting 11 beats with an avg rate of 125 bpm.  3. Seven runs of Supraventricular Tachycardia runs occurred, the run with the fastest interval lasting 10.2 secs with a max rate of 169 bpm, the longest lasting 15.3 secs with an avg rate of 113 bpm.  4. Rare PACs  5. Frequent PVCs (7.2%, 96276), couplets (6.4%, 42564), and occasional triplets (1.5%, 6454).  6. Ventricular Bigeminy and Trigeminy were present.  7. No events in diary  At last visit started on lasix 40 daily. Entresto increased from daily to bid. Mexilitene started. cMRI and sleep study ordered but pending   He presents today for f/u. 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 Denies CP, SOB, edema. Compliant with meds.   Sleeps study 12/24: AHI 16.8   Past Medical History:  Diagnosis Date   CHF (congestive heart failure) (HCC)    PVC (premature ventricular contraction)     Current Outpatient Medications  Medication Sig Dispense  Refill   aspirin EC 81 MG tablet Take 81 mg by mouth daily. Swallow whole.     carvedilol (COREG) 3.125 MG tablet Take 1 tablet (3.125 mg total) by mouth 2 (two) times daily with a meal. 90 tablet 3   empagliflozin (JARDIANCE) 10 MG TABS tablet Take 1 tablet (10 mg total) by mouth daily before breakfast. 30 tablet 5   ENTRESTO 49-51 MG TAKE 1 TABLET BY MOUTH TWICE A DAY 60 tablet 3   meloxicam (MOBIC) 15 MG tablet Take 1 tablet (15 mg total) by mouth daily. 30 tablet 0   mexiletine (MEXITIL) 200 MG capsule Take 1 capsule (200 mg total) by mouth 2 (two) times daily. 60 capsule 11   predniSONE (DELTASONE) 10 MG tablet Take 1 tablet (10 mg total) by mouth daily. 10 tablet 0   spironolactone (ALDACTONE) 25 MG tablet Take 0.5 tablets (12.5 mg total) by mouth daily. (Patient taking differently: Take 25 mg by mouth daily.) 45 tablet 3   furosemide (LASIX) 40 MG tablet Take 1 tablet (40 mg total) by mouth daily. 90 tablet 3   No current facility-administered medications for this visit.    Allergies  Allergen Reactions   Lisinopril Hives   Penicillins Swelling   Latex Rash      Social History   Socioeconomic History   Marital status: Married    Spouse name: Not on file   Number of children: Not on  file   Years of education: Not on file   Highest education level: Not on file  Occupational History   Not on file  Tobacco Use   Smoking status: Former    Current packs/day: 0.00    Types: Cigarettes    Quit date: 2019    Years since quitting: 6.1   Smokeless tobacco: Never  Vaping Use   Vaping status: Never Used  Substance and Sexual Activity   Alcohol use: No    Alcohol/week: 0.0 standard drinks of alcohol   Drug use: No   Sexual activity: Not on file  Other Topics Concern   Not on file  Social History Narrative   Not on file   Social Drivers of Health   Financial Resource Strain: Not on file  Food Insecurity: Not on file  Transportation Needs: Not on file  Physical  Activity: Not on file  Stress: Not on file  Social Connections: Not on file  Intimate Partner Violence: Not on file     No family history on file.  Vitals:   10/14/23 0857  BP: 130/80  Pulse: (!) 51  SpO2: 100%  Weight: 198 lb (89.8 kg)   Wt Readings from Last 3 Encounters:  10/14/23 198 lb (89.8 kg)  10/02/23 198 lb (89.8 kg)  08/15/23 201 lb (91.2 kg)     PHYSICAL EXAM: General:  Well appearing. No resp difficulty HEENT: normal Neck: supple. no JVD. Carotids 2+ bilat; no bruits. No lymphadenopathy or thryomegaly appreciated. Cor: PMI nondisplaced.Huston Foley regular  Lungs: clear Abdomen: soft, nontender, nondistended. No hepatosplenomegaly. No bruits or masses. Good bowel sounds. Extremities: no cyanosis, clubbing, rash, edema Neuro: alert & orientedx3, cranial nerves grossly intact. moves all 4 extremities w/o difficulty. 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 If negative consider genetic w/u (?LMNA). cMRI pending for 10/26/23 - If EF remains < 35% will need to consider ICD and addition of Bidil   2: PVC/NSVT- - prior holter monitoring in 2019 with 47% PVC burden  - zio 05/30/23: NSR with 2,200 runs NSVT. Frequent PVCs (7.2%, 96276), couplets (6.4%, 42564), and occasional triplets (1.5%, 6454) - Continue mexilitene 200 bid - Sleep study  positive - Consider repeat monitor at next visit to reassess burden   3. Moderate OSA - HST 12/24: AHI 16.8  - he has decided not to pursue CPAP  - We discussed other options for treatment - he is not interested  4. Hyperkalemia - check labs today  Arvilla Meres, MD  9:32 AM

## 2023-10-14 NOTE — Addendum Note (Signed)
Addended by: Jola Schmidt A on: 10/14/2023 09:49 AM   Modules accepted: Orders

## 2023-10-15 LAB — BASIC METABOLIC PANEL
BUN/Creatinine Ratio: 18 (ref 10–24)
BUN: 23 mg/dL (ref 8–27)
CO2: 23 mmol/L (ref 20–29)
Calcium: 9.1 mg/dL (ref 8.6–10.2)
Chloride: 105 mmol/L (ref 96–106)
Creatinine, Ser: 1.29 mg/dL — ABNORMAL HIGH (ref 0.76–1.27)
Glucose: 87 mg/dL (ref 70–99)
Potassium: 4.8 mmol/L (ref 3.5–5.2)
Sodium: 139 mmol/L (ref 134–144)
eGFR: 57 mL/min/{1.73_m2} — ABNORMAL LOW (ref >59)

## 2023-10-15 LAB — BRAIN NATRIURETIC PEPTIDE: BNP: 195.7 pg/mL — ABNORMAL HIGH (ref 0.0–100.0)

## 2023-10-26 ENCOUNTER — Other Ambulatory Visit: Payer: Self-pay | Admitting: Internal Medicine

## 2023-10-26 ENCOUNTER — Ambulatory Visit
Admission: RE | Admit: 2023-10-26 | Discharge: 2023-10-26 | Disposition: A | Discharge status: Home / Self Care | Payer: Medicare HMO | Source: Ambulatory Visit | Attending: Internal Medicine | Admitting: Internal Medicine

## 2023-10-26 DIAGNOSIS — I5022 Chronic systolic (congestive) heart failure: Secondary | ICD-10-CM

## 2023-10-26 MED ORDER — GADOBUTROL 1 MMOL/ML IV SOLN
12.0000 mL | Freq: Once | INTRAVENOUS | Status: AC | PRN
  Administered 2023-10-26: 12 mL via INTRAVENOUS

## 2023-10-31 ENCOUNTER — Telehealth (HOSPITAL_COMMUNITY): Payer: Self-pay | Admitting: *Deleted

## 2023-10-31 NOTE — Telephone Encounter (Signed)
 Called patient per Dr. Gala Romney with cardiac MRI results as follows;  "Severe NICM. No infiltrative process"  Pt verbalized understanding of same; no further questions at this time.

## 2023-11-21 ENCOUNTER — Telehealth: Payer: Self-pay | Admitting: Internal Medicine

## 2023-11-21 NOTE — Telephone Encounter (Signed)
 Called to sched recall

## 2023-12-16 ENCOUNTER — Telehealth: Payer: Self-pay | Admitting: Internal Medicine

## 2023-12-16 NOTE — Telephone Encounter (Incomplete)
 Called to confirm/remind patient of their appointment at the [facility] on 12/19/23.   Appointment:   [x] Confirmed  [] Left mess   [] No answer/No voice mail  [] VM Full/unable to leave message  [] Phone not in service  Patient reminded to bring all medications and/or complete list.  Confirmed patient has transportation. Gave directions, instructed to utilize valet parking.

## 2023-12-19 ENCOUNTER — Inpatient Hospital Stay (HOSPITAL_COMMUNITY)
Admission: RE | Admit: 2023-12-19 | Discharge: 2023-12-19 | Disposition: A | Discharge status: Home / Self Care | Source: Ambulatory Visit | Attending: Internal Medicine | Admitting: Internal Medicine

## 2023-12-19 ENCOUNTER — Other Ambulatory Visit
Admission: RE | Admit: 2023-12-19 | Discharge: 2023-12-19 | Disposition: A | Discharge status: Home / Self Care | Source: Ambulatory Visit | Attending: Internal Medicine | Admitting: Internal Medicine

## 2023-12-19 ENCOUNTER — Encounter: Payer: Self-pay | Admitting: Internal Medicine

## 2023-12-19 ENCOUNTER — Ambulatory Visit (HOSPITAL_BASED_OUTPATIENT_CLINIC_OR_DEPARTMENT_OTHER): Admitting: Internal Medicine

## 2023-12-19 ENCOUNTER — Other Ambulatory Visit (HOSPITAL_COMMUNITY): Payer: Self-pay | Admitting: Internal Medicine

## 2023-12-19 VITALS — BP 141/82 | HR 61 | Wt 199.4 lb

## 2023-12-19 DIAGNOSIS — I493 Ventricular premature depolarization: Secondary | ICD-10-CM

## 2023-12-19 DIAGNOSIS — Z87891 Personal history of nicotine dependence: Secondary | ICD-10-CM | POA: Insufficient documentation

## 2023-12-19 DIAGNOSIS — E875 Hyperkalemia: Secondary | ICD-10-CM | POA: Diagnosis not present

## 2023-12-19 DIAGNOSIS — G4733 Obstructive sleep apnea (adult) (pediatric): Secondary | ICD-10-CM

## 2023-12-19 DIAGNOSIS — I5022 Chronic systolic (congestive) heart failure: Secondary | ICD-10-CM | POA: Diagnosis present

## 2023-12-19 DIAGNOSIS — I472 Ventricular tachycardia, unspecified: Secondary | ICD-10-CM | POA: Insufficient documentation

## 2023-12-19 LAB — BASIC METABOLIC PANEL WITH GFR
Anion gap: 5 (ref 5–15)
BUN: 18 mg/dL (ref 8–23)
CO2: 26 mmol/L (ref 22–32)
Calcium: 8.9 mg/dL (ref 8.9–10.3)
Chloride: 109 mmol/L (ref 98–111)
Creatinine, Ser: 1.48 mg/dL — ABNORMAL HIGH (ref 0.61–1.24)
GFR, Estimated: 48 mL/min — ABNORMAL LOW (ref >60)
Glucose, Bld: 88 mg/dL (ref 70–99)
Potassium: 4.5 mmol/L (ref 3.5–5.1)
Sodium: 140 mmol/L (ref 135–145)

## 2023-12-19 LAB — BRAIN NATRIURETIC PEPTIDE: B Natriuretic Peptide: 652.4 pg/mL — ABNORMAL HIGH (ref 0.0–100.0)

## 2023-12-19 NOTE — Patient Instructions (Addendum)
 Lab Work:  Go over to the [facility]. Go pass the gift shop and have your blood work completed.  We will only call you if the results are abnormal or if the provider would like to make medication changes.   Testing/Procedures:  Your provider has recommended that you wear a Zio Patch for 7 days.  This monitor will record your heart rhythm for our review.  IF you have any symptoms while wearing the monitor please press the button.  If you have any issues with the patch or you notice a red or orange light on it please call the company at [phone].  Once you remove the patch please mail it back to the company as soon as possible so we can get the results.    Follow-Up in: 3 MONTHS WITH DR. Julane Ny.  Our Doctors' schedules are NOT open yet for 3 months. We will place you on our recall list. Once they are available, we will call you to schedule your follow up appointment.   At the [facility], you and your health needs are our priority. We have a designated team specialized in the treatment of Heart Failure. This Care Team includes your primary Heart Failure Specialized Cardiologist (physician), Advanced Practice Providers (APPs- Physician Assistants and Nurse Practitioners), and Pharmacist who all work together to provide you with the care you need, when you need it.   You may see any of the following providers on your designated Care Team at your next follow up:  Dr. Jules Oar Dr. Peder Bourdon Dr. Alwin Baars Dr. Judyth Nunnery Shawnee Dellen, FNP Bevely Brush, RPH-CPP  Please be sure to bring in all your medications bottles to every appointment.   Need to Contact Us :  If you have any questions or concerns before your next appointment please send us  a message through [place] or call our office at [phone].    TO LEAVE A MESSAGE FOR THE NURSE SELECT OPTION 2, PLEASE LEAVE A MESSAGE INCLUDING: YOUR NAME DATE OF BIRTH CALL BACK NUMBER REASON FOR  CALL**this is important as we prioritize the call backs  YOU WILL RECEIVE A CALL BACK THE SAME DAY AS LONG AS YOU CALL BEFORE 4:00 PM

## 2023-12-19 NOTE — Progress Notes (Signed)
 [facility] CONSULT NOTE  Referring Physician: Madaline Scales, PA-C Primary Care: Anthony Scales, PA-C Primary Cardiologist: None  HPI:  [name] is a 78 y/o male with a history of PVC's, moderate mitral insufficiency, previous tobacco use (quit 2019) and chronic heart failure.    Has not been admitted or been in the ED in the last 6 months.   Myoview 12/19 EF 19% No ischemia or infarct  Echo 8/21 EF 35-40%    Echo 04/11/23: EF 25%   Stress test 04/11/23: 1.  Negative ETT though specificity limited by baseline ECG abnormalities  2.  Severely reduced left ventricular function with estimated LV ejection fraction 20%  3.  Global hypokinesis  4.  No tentative evidence of for scar or ischemia   Zio 10/24  1. Sinus rhythm -  avg HR of 67 bpm. 2. 2266 runs of nonsustained Ventricular Tachycardia runs occurred, the run with the fastest interval lasting 4 beats with a max rate of 190 bpm, the longest lasting 11 beats with an avg rate of 125 bpm.  3. Seven runs of Supraventricular Tachycardia runs occurred, the run with the fastest interval lasting 10.2 secs with a max rate of 169 bpm, the longest lasting 15.3 secs with an avg rate of 113 bpm.  4. Rare PACs  5. Frequent PVCs (7.2%, 96276), couplets (6.4%, 42564), and occasional triplets (1.5%, 6454).  6. Ventricular Bigeminy and Trigeminy were present.  7. No events in diary  At last visit started on lasix  40 daily. Entresto  increased from daily to bid. Mexilitene started. cMRI and sleep study ordered but pending   He presents today for f/u. Says he feels good. 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 Occasional edema. Compliant with meds.   Sleeps study 12/24: AHI 16.8   cMRI  2/25 1. Normal LV size, severely reduced LV systolic function. LVEF 17%.   2. Small, midwall, basal septal LV late gadolinium enhancement at the RV insertion points.   3.  No evidence for infiltrative or  inflammatory disease.   4.  No significant valvular abnormalities.  RVEF 25%    5.  Etiology for cardiomyopathy appears non-ischemic.    Past Medical History:  Diagnosis Date   CHF (congestive heart failure) (HCC)    PVC (premature ventricular contraction)     Current Outpatient Medications  Medication Sig Dispense Refill   aspirin EC 81 MG tablet Take 81 mg by mouth daily. Swallow whole.     carvedilol  (COREG ) 3.125 MG tablet Take 1 tablet (3.125 mg total) by mouth 2 (two) times daily with a meal. 90 tablet 3   empagliflozin  (JARDIANCE ) 10 MG TABS tablet Take 1 tablet (10 mg total) by mouth daily before breakfast. 30 tablet 11   ENTRESTO  49-51 MG TAKE 1 TABLET BY MOUTH TWICE A DAY 60 tablet 3   mexiletine (MEXITIL) 200 MG capsule Take 1 capsule (200 mg total) by mouth 2 (two) times daily. 60 capsule 11   No current facility-administered medications for this visit.    Allergies  Allergen Reactions   Lisinopril Hives   Penicillins Swelling   Latex Rash      Social History   Socioeconomic History   Marital status: Married    Spouse name: Not on file   Number of children: Not on file   Years of education: Not on file   Highest education level: Not on file  Occupational History   Not on file  Tobacco  Use   Smoking status: Former    Current packs/day: 0.00    Types: Cigarettes    Quit date: 2019    Years since quitting: 6.3   Smokeless tobacco: Never  Vaping Use   Vaping status: Never Used  Substance and Sexual Activity   Alcohol use: No    Alcohol/week: 0.0 standard drinks of alcohol   Drug use: No   Sexual activity: Not on file  Other Topics Concern   Not on file  Social History Narrative   Not on file   Social Drivers of Health   Financial Resource Strain: Not on file  Food Insecurity: Not on file  Transportation Needs: Not on file  Physical Activity: Not on file  Stress: Not on file  Social Connections: Not on file  Intimate Partner Violence: Not  on file     No family history on file.  Vitals:   12/19/23 1544  BP: (!) 141/82  Pulse: 61  SpO2: 99%  Weight: 199 lb 6.4 oz (90.4 kg)   Wt Readings from Last 3 Encounters:  12/19/23 199 lb 6.4 oz (90.4 kg)  10/14/23 198 lb (89.8 kg)  10/02/23 198 lb (89.8 kg)     PHYSICAL EXAM: General:  [place] Well appearing. No resp difficulty HEENT: normal Neck: supple. no JVD. Carotids 2+ bilat; no bruits. No lymphadenopathy or thryomegaly appreciated. Cor: PMI nondisplaced. Regular rate & rhythm. No rubs, gallops or murmurs. Lungs: clear Abdomen: soft, nontender, nondistended. No hepatosplenomegaly. No bruits or masses. Good bowel sounds. Extremities: no cyanosis, clubbing, rash, edema Neuro: alert & orientedx3, cranial nerves grossly intact. moves all 4 extremities w/o difficulty. Affect pleasant    ASSESSMENT & PLAN:  1: Chronic systolic HF -  Myoview 12/19 EF 19% No ischemia or infarct - Echo 8/21 EF 35-40%   - Echo 04/11/23: EF 25% - suspect PVC cardiomyopathy with progressive LV dysfunction  - Has not had invasive ischemic eval but non-ischemic evaluations have not suggested underlying CAD - Improved NYHA I-II - Volume status ok - takes lasix  as needed  - I had planned to increase Entresto  to 97/103 bid today but will hold off given recent hyperkalemia - Continue carvedilol  3.125mg  BID;  - Continue jardiance  10mg  daily - Continue lasix  40 daily PRN only - Continue spiro 12.5 daily  - Plan cMRI to evaluate for infiltrative disease/sarcoid. If negative consider genetic w/u (?LMNA). cMRI pending for 10/26/23 - If EF remains < 35% will need to consider ICD and addition of Bidil   2: PVC/NSVT- - prior holter monitoring in 2019 with 47% PVC burden  - zio 05/30/23: NSR with 2,200 runs NSVT. Frequent PVCs (7.2%, 96276), couplets (6.4%, 42564), and occasional triplets (1.5%, 6454) - Continue mexilitene 200 bid - Sleep study positive - Consider repeat monitor at next visit to  reassess burden   3. Moderate OSA - HST 12/24: AHI 16.8  - he has decided not to pursue CPAP  - We discussed other options for treatment - he is not interested  4. Hyperkalemia - check labs today  Anthony Oar, MD  3:53 PM

## 2024-01-06 ENCOUNTER — Other Ambulatory Visit: Payer: Self-pay | Admitting: Family

## 2024-01-10 NOTE — Addendum Note (Signed)
 Encounter addended by: Stan Eans, RN on: 01/10/2024 12:17 PM  Actions taken: Imaging Exam ended

## 2024-01-23 ENCOUNTER — Ambulatory Visit (HOSPITAL_COMMUNITY): Payer: Self-pay | Admitting: Internal Medicine

## 2024-01-24 MED ORDER — MEXILETINE HCL 150 MG PO CAPS: 300.0000 mg | ORAL_CAPSULE | Freq: Two times a day (BID) | ORAL | 6 refills | Status: DC

## 2024-01-24 NOTE — Telephone Encounter (Signed)
 Called pt to relay new medication starting. Pt complained that he is having to urinate frequently and that he does not want to take anything else that makes him have frequent urination. Pt states "I'm going to pee to death".   Orders placed per md. Verified pharmacy. No further questions at this time. Pt reluctantly agreeable to plan.

## 2024-01-24 NOTE — Telephone Encounter (Signed)
-----   Message from Nurse Emer C sent at 01/24/2024 10:32 AM EDT -----  ----- Message ----- From: Mardell Shade, MD Sent: 01/23/2024   8:52 PM EDT To: [facility] Triage Pool  Please increase mexilitene to 300 bid. Patient refuses ICD.

## 2024-01-25 ENCOUNTER — Other Ambulatory Visit (HOSPITAL_COMMUNITY): Payer: Self-pay | Admitting: Internal Medicine

## 2024-04-25 ENCOUNTER — Telehealth: Payer: Self-pay | Admitting: Internal Medicine

## 2024-04-25 NOTE — Telephone Encounter (Signed)
 Called to confirm/remind patient of their appointment at the [facility] on 04/26/24.   Appointment:   [] Confirmed  [] Left mess   [x] No answer/No voice mail  [] VM Full/unable to leave message  [] Phone not in service  Patient reminded to bring all medications and/or complete list.  Confirmed patient has transportation. Gave directions, instructed to utilize valet parking.

## 2024-04-26 ENCOUNTER — Other Ambulatory Visit (HOSPITAL_COMMUNITY): Payer: Self-pay

## 2024-04-26 ENCOUNTER — Other Ambulatory Visit: Payer: Self-pay | Admitting: Internal Medicine

## 2024-04-26 ENCOUNTER — Ambulatory Visit: Attending: Internal Medicine | Admitting: Internal Medicine

## 2024-04-26 VITALS — BP 128/77 | HR 55 | Wt 197.0 lb

## 2024-04-26 DIAGNOSIS — I4729 Other ventricular tachycardia: Secondary | ICD-10-CM

## 2024-04-26 DIAGNOSIS — Z7984 Long term (current) use of oral hypoglycemic drugs: Secondary | ICD-10-CM | POA: Insufficient documentation

## 2024-04-26 DIAGNOSIS — I493 Ventricular premature depolarization: Secondary | ICD-10-CM | POA: Insufficient documentation

## 2024-04-26 DIAGNOSIS — I472 Ventricular tachycardia, unspecified: Secondary | ICD-10-CM | POA: Diagnosis not present

## 2024-04-26 DIAGNOSIS — Z7982 Long term (current) use of aspirin: Secondary | ICD-10-CM | POA: Insufficient documentation

## 2024-04-26 DIAGNOSIS — I2510 Atherosclerotic heart disease of native coronary artery without angina pectoris: Secondary | ICD-10-CM | POA: Insufficient documentation

## 2024-04-26 DIAGNOSIS — G4733 Obstructive sleep apnea (adult) (pediatric): Secondary | ICD-10-CM | POA: Diagnosis not present

## 2024-04-26 DIAGNOSIS — I5022 Chronic systolic (congestive) heart failure: Secondary | ICD-10-CM | POA: Insufficient documentation

## 2024-04-26 DIAGNOSIS — E875 Hyperkalemia: Secondary | ICD-10-CM | POA: Insufficient documentation

## 2024-04-26 DIAGNOSIS — Z87891 Personal history of nicotine dependence: Secondary | ICD-10-CM | POA: Insufficient documentation

## 2024-04-26 DIAGNOSIS — Z79899 Other long term (current) drug therapy: Secondary | ICD-10-CM | POA: Diagnosis not present

## 2024-04-26 MED ORDER — MEXILETINE HCL 150 MG PO CAPS: 300.0000 mg | ORAL_CAPSULE | Freq: Two times a day (BID) | ORAL | 6 refills | Status: DC

## 2024-04-26 NOTE — Progress Notes (Signed)
 [facility] NOTE  Referring Physician: Johnie Perkins, PA-C Primary Care: Johnie Perkins, PA-C Primary Cardiologist: None  HPI:  Anthony Richard is a 78 y/o male with a history of PVC's, moderate mitral insufficiency, previous tobacco use (quit 2019) and chronic heart failure.     Myoview 12/19 EF 19% No ischemia or infarct Echo 8/21 EF 35-40%  Echo 04/11/23: EF 25%   Stress test 04/11/23: 1.  Negative ETT though specificity limited by baseline ECG abnormalities  2.  Severely reduced left ventricular function with estimated LV ejection fraction 20%  3.  Global hypokinesis  4.  No tentative evidence of for scar or ischemia   Zio 10/24  1. Sinus rhythm -  avg HR of 67 bpm. 2. 2266 runs of nonsustained Ventricular Tachycardia runs occurred -longest 11 beats 5. Frequent PVCs (7.2%, 96276)  cMRI  2/25 LVEF 17%.Small, midwall, basal septal LV late gadolinium enhancement at the RV insertion points. No evidence for infiltrative or inflammatory disease.SABRARVEF 25%   Sleep study 12/24: AHI 16.8   F/u Zio on mexilitene 1. Sinus rhythm - avg HR of 95 bpm.  2. 6895 runs of nonsustained Ventricular Tachycardia -longest 18 beat 4. Frequent PVCs (13.7%, 59016),  Mexilitene increased to 300 bid. Patient refused any further testing   He presents today for f/u. Says he feels good. 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 No palpitations. No edema, orthopnea or PND. Active at [facility]. Not using lasix . Says he has not been taking mexilitene for a long time - it's been in the cabinet    Past Medical History:  Diagnosis Date   CHF (congestive heart failure) (HCC)    PVC (premature ventricular contraction)     Current Outpatient Medications  Medication Sig Dispense Refill   aspirin EC 81 MG tablet Take 81 mg by mouth daily. Swallow whole.     carvedilol  (COREG ) 3.125 MG tablet Take 1 tablet (3.125 mg total) by mouth 2 (two) times daily with a meal. 90  tablet 3   empagliflozin  (JARDIANCE ) 10 MG TABS tablet Take 1 tablet (10 mg total) by mouth daily before breakfast. 30 tablet 11   ENTRESTO  49-51 MG TAKE 1 TABLET BY MOUTH TWICE A DAY 60 tablet 3   potassium chloride  SA (KLOR-CON  M) 20 MEQ tablet Take 20 mEq by mouth daily.     mexiletine (MEXITIL) 150 MG capsule Take 2 capsules (300 mg total) by mouth 2 (two) times daily. 120 capsule 6   No current facility-administered medications for this visit.    Allergies  Allergen Reactions   Lisinopril Hives   Penicillins Swelling   Latex Rash      Social History   Socioeconomic History   Marital status: Married    Spouse name: Not on file   Number of children: Not on file   Years of education: Not on file   Highest education level: Not on file  Occupational History   Not on file  Tobacco Use   Smoking status: Former    Current packs/day: 0.00    Types: Cigarettes    Quit date: 2019    Years since quitting: 6.6   Smokeless tobacco: Never  Vaping Use   Vaping status: Never Used  Substance and Sexual Activity   Alcohol use: No    Alcohol/week: 0.0 standard drinks of alcohol   Drug use: No   Sexual activity: Not on file  Other Topics Concern   Not on file  Social History Narrative   Not on file   Social Drivers of Health   Financial Resource Strain: Not on file  Food Insecurity: Not on file  Transportation Needs: Not on file  Physical Activity: Not on file  Stress: Not on file  Social Connections: Not on file  Intimate Partner Violence: Not on file     No family history on file.  Vitals:   04/26/24 0954  BP: 128/77  Pulse: (!) 55  SpO2: 100%  Weight: 197 lb (89.4 kg)    Wt Readings from Last 3 Encounters:  04/26/24 197 lb (89.4 kg)  12/19/23 199 lb 6.4 oz (90.4 kg)  10/14/23 198 lb (89.8 kg)     PHYSICAL EXAM: General:  Elderly No resp difficulty HEENT: normal Neck: supple. no JVD. Carotids 2+ bilat; no bruits. No lymphadenopathy or thryomegaly  appreciated. Cor: PMI nondisplaced. Mildly irregular rate & rhythm. No rubs, gallops or murmurs. Lungs: clear Abdomen: soft, nontender, nondistended. No hepatosplenomegaly. No bruits or masses. Good bowel sounds. Extremities: no cyanosis, clubbing, rash, edema Neuro: alert & orientedx3, cranial nerves grossly intact. moves all 4 extremities w/o difficulty. 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 No evidence for infiltrative or inflammatory disease.SABRARVEF 25%  - Stable NYHA I - volume ok  - Continue Entresto  49/51 bid - Continue carvedilol  3.125mg  BID;  - Continue jardiance  10mg  daily - Continue lasix  40 daily PRN only - Continue spiro 12.5 daily  - I suspect PVC cardiomyopathy and discovered today that he has not been taking mexilitene. We had a long discussion about this and that his heart dysfunction may be entirely related to his PVCs. He has limited understanding of his heart condition but seemd to understand how important I felt this was. We will call him when he gets home to make sure he can read the mexilitene label to us  and start taking.  - We have had multiple discussions about next steps including definitive ischemic eval (cath), genetic testing and referral for ICD - He has been clear that at this time he does not want to proceed with further testing modalities or ICD. If it my time, its my time. However he seemed more open to possibility of ICD down the road today. We will continue to discuss and see what happens when he is actually taking the mexilitene.  - Not candidate for advanced therapies  currently   2: PVC/NSVT- - prior holter monitoring in 2019 with 47% PVC burden  - zio 05/30/23: NSR with 2,200 runs NSVT. Frequent PVCs (7.2%, 96276), couplets (6.4%, 42564), and occasional triplets (1.5%, 6454) - F/u Zio 3/25 on mexilitene. Sinus rhythm - avg HR of 95 bpm, 6895 runs of NSVT-longest 18 beats, frequent PVCs (13.7%, 59016), - As above - not taking mexilitene. Encouraged him to start - Sleep study positive  3. Mild to moderate OSA - HST 12/24: AHI 16.8  - he has decided not to pursue CPAP  - We discussed other options for treatment - he is not interested - no change  4. Hyperkalemia - repeat labs  I spent a total of  44 minutes today: 1) reviewing the patient's medical records including previous charts, labs and recent notes from other providers; 2) examining the patient and counseling them on their medical issues/explaining the plan of care; 3) adjusting meds as needed and 4) ordering lab work or other needed tests.    Toribio Fuel, MD  11:13 AM

## 2024-04-26 NOTE — Patient Instructions (Signed)
 Medication Changes:  RESTART Mexiletine 300mg  (2 tabs) two times daily   Follow-Up in: Please follow up with the Advanced [facility] in 2 months with Dr. Bensimhon. We do not currently have that schedule. Please give us  a call in Late September in order to schedule your appointment for October.    Thank you for choosing [facility].    At the [facility] Clinic, you and your health needs are our priority. We have a designated team specialized in the treatment of Heart Failure. This Care Team includes your primary Heart Failure Specialized Cardiologist (physician), Advanced Practice Providers (APPs- Physician Assistants and Nurse Practitioners), and Pharmacist who all work together to provide you with the care you need, when you need it.   You may see any of the following providers on your designated Care Team at your next follow up:  Dr. Toribio Fuel Dr. Ezra Shuck Dr. Ria Commander Dr. Morene Brownie Ellouise Class, FNP Jaun Bash, RPH-CPP  Please be sure to bring in all your medications bottles to every appointment.   Need to Contact Us :  If you have any questions or concerns before your next appointment please send us  a message through [place] or call our office at [phone].    TO LEAVE A MESSAGE FOR THE NURSE SELECT OPTION 2, PLEASE LEAVE A MESSAGE INCLUDING: YOUR NAME DATE OF BIRTH CALL BACK NUMBER REASON FOR CALL**this is important as we prioritize the call backs  YOU WILL RECEIVE A CALL BACK THE SAME DAY AS LONG AS YOU CALL BEFORE 4:00 PM

## 2024-04-27 ENCOUNTER — Other Ambulatory Visit (HOSPITAL_COMMUNITY): Payer: Self-pay

## 2024-04-27 ENCOUNTER — Telehealth (HOSPITAL_COMMUNITY): Payer: Self-pay

## 2024-04-27 MED ORDER — MEXILETINE HCL 150 MG PO CAPS: 300.0000 mg | ORAL_CAPSULE | Freq: Two times a day (BID) | ORAL | 6 refills | Status: AC

## 2024-04-27 NOTE — Telephone Encounter (Signed)
 Advanced Heart Failure Patient Advocate Encounter  Prior authorization for Mexiletine has been submitted and approved. Test billing returns $0 for 90 day supply.  Key: [id number] Effective: 08/31/2023 to 08/29/2024  Called pharmacy to reprocess, confirmed $0 copay, pharmacy is ordering medication for Monday.  Rachel DEL, CPhT Rx Patient Advocate Phone: [phone]

## 2024-06-30 ENCOUNTER — Other Ambulatory Visit: Payer: Self-pay | Admitting: Internal Medicine

## 2024-07-12 ENCOUNTER — Other Ambulatory Visit (HOSPITAL_COMMUNITY): Payer: Self-pay

## 2024-07-16 ENCOUNTER — Ambulatory Visit: Admitting: Internal Medicine

## 2024-07-20 ENCOUNTER — Telehealth: Payer: Self-pay | Admitting: Family

## 2024-07-20 NOTE — Telephone Encounter (Signed)
 Called to confirm/remind patient of their appointment at the [facility] on 07/23/24.   Appointment:   [] Confirmed  [x] Left mess   [] No answer/No voice mail  [] VM Full/unable to leave message  [] Phone not in service  Patient reminded to bring all medications and/or complete list.  Confirmed patient has transportation. Gave directions, instructed to utilize valet parking.

## 2024-07-22 NOTE — Progress Notes (Unsigned)
 ADVANCED HF CLINIC NOTE  Referring Physician: Johnie Perkins, PA-C Primary Care: Johnie Perkins, PA-C Primary Cardiologist: None HF provider: Cherrie Sieving, MD  Chief Complaint: pedal edema   HPI:  Anthony Richard is a 78 y/o male with a history of PVC's, moderate mitral insufficiency, previous tobacco use (quit 2019) and chronic heart failure.    Myoview 12/19 EF 19% No ischemia or infarct Echo 8/21 EF 35-40%  Echo 04/11/23: EF 25%   Stress test 04/11/23: 1.  Negative ETT though specificity limited by baseline ECG abnormalities  2.  Severely reduced left ventricular function with estimated LV ejection fraction 20%  3.  Global hypokinesis  4.  No tentative evidence of for scar or ischemia   Zio 10/24  1. Sinus rhythm -  avg HR of 67 bpm. 2. 2266 runs of nonsustained Ventricular Tachycardia runs occurred -longest 11 beats 5. Frequent PVCs (7.2%, 96276)  cMRI  2/25 LVEF 17%.Small, midwall, basal septal LV late gadolinium enhancement at the RV insertion points. No evidence for infiltrative or inflammatory disease.SABRARVEF 25%   Sleep study 12/24: AHI 16.8   F/u Zio on mexilitene 1. Sinus rhythm - avg HR of 95 bpm.  2. 6895 runs of nonsustained Ventricular Tachycardia -longest 18 beat 4. Frequent PVCs (13.7%, 59016),  Mexilitene increased to 300 bid. Patient refused any further testing   He presents today for a HF follow-up visit with a chief complaint of minimal pedal edema. Denies shortness of breath, fatigue, chest pain, palpitations, abdominal distention. Says that he's been taking his carvedilol  and entresto  once daily. Says that he's been taking mexiletine twice daily. Is not clear if he has lasix / potassium at home but he says that he's not been taking them.   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   ROS: All systems negative except what is listed in HPI, PMH and Problem List   Past Medical History:  Diagnosis Date   CHF  (congestive heart failure) (HCC)    PVC (premature ventricular contraction)     Current Outpatient Medications  Medication Sig Dispense Refill   aspirin EC 81 MG tablet Take 81 mg by mouth daily. Swallow whole.     carvedilol  (COREG ) 3.125 MG tablet Take 1 tablet (3.125 mg total) by mouth 2 (two) times daily with a meal. 90 tablet 3   empagliflozin  (JARDIANCE ) 10 MG TABS tablet Take 1 tablet (10 mg total) by mouth daily before breakfast. 30 tablet 11   ENTRESTO  49-51 MG TAKE 1 TABLET BY MOUTH TWICE A DAY 60 tablet 3   furosemide  (LASIX ) 40 MG tablet Take 1 tablet (40 mg total) by mouth daily as needed for fluid. 90 tablet 0   mexiletine (MEXITIL) 150 MG capsule Take 2 capsules (300 mg total) by mouth 2 (two) times daily. 120 capsule 6   potassium chloride  SA (KLOR-CON  M) 20 MEQ tablet TAKE 1 TABLET BY MOUTH EVERY DAY 90 tablet 3   No current facility-administered medications for this visit.    Allergies  Allergen Reactions   Lisinopril Hives   Penicillins Swelling   Latex Rash      Social History   Socioeconomic History   Marital status: Married    Spouse name: Not on file   Number of children: Not on file   Years of education: Not on file   Highest education level: Not on file  Occupational History   Not on file  Tobacco Use   Smoking status: Former  Current packs/day: 0.00    Types: Cigarettes    Quit date: 2019    Years since quitting: 6.8   Smokeless tobacco: Never  Vaping Use   Vaping status: Never Used  Substance and Sexual Activity   Alcohol use: No    Alcohol/week: 0.0 standard drinks of alcohol   Drug use: No   Sexual activity: Not on file  Other Topics Concern   Not on file  Social History Narrative   Not on file   Social Drivers of Health   Financial Resource Strain: Not on file  Food Insecurity: Not on file  Transportation Needs: Not on file  Physical Activity: Not on file  Stress: Not on file  Social Connections: Not on file  Intimate  Partner Violence: Not on file     No family history on file.  Vitals:   07/23/24 1348  BP: 138/82  Pulse: 85  SpO2: 98%  Weight: 203 lb (92.1 kg)   Wt Readings from Last 3 Encounters:  07/23/24 203 lb (92.1 kg)  04/26/24 197 lb (89.4 kg)  12/19/23 199 lb 6.4 oz (90.4 kg)   Lab Results  Component Value Date   CREATININE 1.48 (H) 12/19/2023   CREATININE 1.29 (H) 10/14/2023   CREATININE 1.67 (H) 08/15/2023    PHYSICAL EXAM:  General: Well appearing elderly male.  Cor: No JVD. Irregular rhythm, rate.  Lungs: clear Abdomen: soft, nontender, nondistended. Extremities: 2+ pitting edema bilateral lower legs to knees Neuro:. 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 No evidence for infiltrative or inflammatory disease.SABRARVEF 25%  - Stable NYHA I  - does have quite a bit of pedal edema. Lasix  40 / K+ 20meq daily X 3 days, then PRN base on weight/ symptoms - Continue Entresto  49/51 bid. Emphasized taking this BID, otherwise, will switch to daily valsartan - Continue carvedilol  3.125mg  BID. Emphasized taking this BID, otherwise, will switch to daily toprol  - Continue jardiance  10mg  daily - Hasn't had spironolactone  filled since 02/25. Will discuss adding this at next visit - Has been taking mexilitene although unclear if taking daily or BID as ordered.   - We have had multiple discussions about next steps including definitive ischemic eval (cath), genetic testing and referral for ICD. He seems to have limited understanding of his heart failure.  - He has been clear that at this time he does not want to  proceed with further testing modalities or ICD. If it my time, its my time. However he seemed more open to possibility of ICD down the road today. We will continue to discuss and see what happens when he is actually taking the mexilitene.  - Not candidate for advanced therapies currently - BNP 12/19/23 was 652.4. ProBNP today  2: PVC/NSVT- - prior holter monitoring in 2019 with 47% PVC burden  - zio 05/30/23: NSR with 2,200 runs NSVT. Frequent PVCs (7.2%, 96276), couplets (6.4%, 42564), and occasional triplets (1.5%, 6454) - F/u Zio 3/25 on mexilitene. Sinus rhythm - avg HR of 95 bpm, 6895 runs of NSVT-longest 18 beats, frequent PVCs (13.7%, 59016), - Has been taking mexiletine although  unclear if he's taking it daily or BID. Patient is also unclear.  - Mg level today  3. Mild to moderate OSA - HST 12/24: AHI 16.8  - he has decided not to pursue CPAP  - We discussed other options for treatment - he is not interested  4. Hyperkalemia - BMET 12/19/23 reviewed: sodium 140, potassium 4.5, creatinine 1.48, GFR 48 - BMET today   Return in 1 month, sooner if needed.   I spent 30 minutes reviewing records, interviewing/ examing patient and managing plan/ orders.   Anthony DELENA Class, FNP-C 07/23/24

## 2024-07-23 ENCOUNTER — Encounter: Payer: Self-pay | Admitting: Family

## 2024-07-23 ENCOUNTER — Ambulatory Visit: Attending: Family | Admitting: Family

## 2024-07-23 VITALS — BP 138/82 | HR 85 | Wt 203.0 lb

## 2024-07-23 DIAGNOSIS — G4733 Obstructive sleep apnea (adult) (pediatric): Secondary | ICD-10-CM | POA: Diagnosis not present

## 2024-07-23 DIAGNOSIS — I340 Nonrheumatic mitral (valve) insufficiency: Secondary | ICD-10-CM | POA: Insufficient documentation

## 2024-07-23 DIAGNOSIS — Z87891 Personal history of nicotine dependence: Secondary | ICD-10-CM | POA: Insufficient documentation

## 2024-07-23 DIAGNOSIS — Z7982 Long term (current) use of aspirin: Secondary | ICD-10-CM | POA: Insufficient documentation

## 2024-07-23 DIAGNOSIS — I493 Ventricular premature depolarization: Secondary | ICD-10-CM | POA: Insufficient documentation

## 2024-07-23 DIAGNOSIS — Z79899 Other long term (current) drug therapy: Secondary | ICD-10-CM | POA: Diagnosis not present

## 2024-07-23 DIAGNOSIS — E875 Hyperkalemia: Secondary | ICD-10-CM | POA: Diagnosis not present

## 2024-07-23 DIAGNOSIS — I5022 Chronic systolic (congestive) heart failure: Secondary | ICD-10-CM | POA: Insufficient documentation

## 2024-07-23 DIAGNOSIS — R600 Localized edema: Secondary | ICD-10-CM | POA: Diagnosis present

## 2024-07-23 DIAGNOSIS — Z7984 Long term (current) use of oral hypoglycemic drugs: Secondary | ICD-10-CM | POA: Insufficient documentation

## 2024-07-23 DIAGNOSIS — I4729 Other ventricular tachycardia: Secondary | ICD-10-CM | POA: Insufficient documentation

## 2024-07-23 NOTE — Patient Instructions (Signed)
 Take entresto  and carvedilol  in the morning and also in the evening. You will need to take both of these medications twice a day.   Medication Changes:  START Furosemide  40mg  (1 tab) daily FOR THREE DAYS THEN RESUME Furosemide  40mg  daily as needed.  START Potassium 20meq (1 tab) daily FOR THREE DAYS THEN RESUME taking Potassium 1 tab only when you take Furosemide   Lab Work:   Go downstairs to [facility] on [address] to have your blood work completed.  We will only call you if the results are abnormal or if the provider would like to make medication changes.  No news is good news.   Follow-Up in: Please follow up with the Advanced Heart Failure Clinic in 1 month with Ellouise Class, FNP.   Thank you for choosing [facility].    At the Advanced Heart Failure Clinic, you and your health needs are our priority. We have a designated team specialized in the treatment of Heart Failure. This Care Team includes your primary Heart Failure Specialized Cardiologist (physician), Advanced Practice Providers (APPs- Physician Assistants and Nurse Practitioners), and Pharmacist who all work together to provide you with the care you need, when you need it.   You may see any of the following providers on your designated Care Team at your next follow up:  Dr. Toribio Fuel Dr. Ezra Shuck Dr. Ria Commander Dr. Morene Brownie Ellouise Class, FNP Jaun Bash, RPH-CPP  Please be sure to bring in all your medications bottles to every appointment.   Need to Contact Us :  If you have any questions or concerns before your next appointment please send us  a message through [place] or call our office at [phone].    TO LEAVE A MESSAGE FOR THE NURSE SELECT OPTION 2, PLEASE LEAVE A MESSAGE INCLUDING: YOUR NAME DATE OF BIRTH CALL BACK NUMBER REASON FOR CALL**this is important as we prioritize the call backs  YOU WILL RECEIVE A CALL BACK THE SAME DAY AS LONG  AS YOU CALL BEFORE 4:00 PM

## 2024-07-24 ENCOUNTER — Ambulatory Visit: Payer: Self-pay | Admitting: Family

## 2024-07-24 DIAGNOSIS — I5022 Chronic systolic (congestive) heart failure: Secondary | ICD-10-CM

## 2024-07-24 LAB — BASIC METABOLIC PANEL WITH GFR
BUN/Creatinine Ratio: 14 (ref 10–24)
BUN: 21 mg/dL (ref 8–27)
CO2: 22 mmol/L (ref 20–29)
Calcium: 9.3 mg/dL (ref 8.6–10.2)
Chloride: 103 mmol/L (ref 96–106)
Creatinine, Ser: 1.55 mg/dL — ABNORMAL HIGH (ref 0.76–1.27)
Glucose: 82 mg/dL (ref 70–99)
Potassium: 5 mmol/L (ref 3.5–5.2)
Sodium: 137 mmol/L (ref 134–144)
eGFR: 46 mL/min/1.73 — ABNORMAL LOW

## 2024-07-24 LAB — PRO B NATRIURETIC PEPTIDE: NT-Pro BNP: 2446 pg/mL — ABNORMAL HIGH (ref 0–486)

## 2024-07-24 LAB — MAGNESIUM: Magnesium: 2.2 mg/dL (ref 1.6–2.3)

## 2024-07-24 NOTE — Telephone Encounter (Signed)
-----   Message from Ellouise DELENA Class sent at 07/24/2024  8:10 AM EST ----- Potassium level on the high side, no need to take any potassium tablets. Originally was to stop lasix  after 3 days, needs to now continue it daily.   BMET in 2 weeks.  ----- Message ----- From: Interface, [facility] Lab Results In Sent: 07/24/2024   3:36 AM EST To: Ellouise DELENA Class, FNP

## 2024-07-24 NOTE — Telephone Encounter (Signed)
 Patient verbalized understanding and agreement. He states he will have to pick up more furosemide  from his pharmacy.  He states he will come to the [facility] for lab work on 08/06/24.  BMET order placed.

## 2024-08-06 ENCOUNTER — Ambulatory Visit: Payer: Self-pay | Admitting: Family

## 2024-08-06 ENCOUNTER — Other Ambulatory Visit: Payer: Self-pay | Admitting: Emergency Medicine

## 2024-08-06 ENCOUNTER — Other Ambulatory Visit: Admission: RE | Admit: 2024-08-06 | Discharge: 2024-08-06 | Disposition: A | Attending: Family | Admitting: Family

## 2024-08-06 DIAGNOSIS — I5022 Chronic systolic (congestive) heart failure: Secondary | ICD-10-CM

## 2024-08-06 LAB — BASIC METABOLIC PANEL WITH GFR
Anion gap: 8 (ref 5–15)
BUN: 29 mg/dL — ABNORMAL HIGH (ref 8–23)
CO2: 23 mmol/L (ref 22–32)
Calcium: 9.5 mg/dL (ref 8.9–10.3)
Chloride: 110 mmol/L (ref 98–111)
Creatinine, Ser: 1.71 mg/dL — ABNORMAL HIGH (ref 0.61–1.24)
GFR, Estimated: 40 mL/min — ABNORMAL LOW (ref >60)
Glucose, Bld: 84 mg/dL (ref 70–99)
Potassium: 5.1 mmol/L (ref 3.5–5.1)
Sodium: 141 mmol/L (ref 135–145)

## 2024-08-06 MED ORDER — SACUBITRIL-VALSARTAN 24-26 MG PO TABS: 1.0000 | ORAL_TABLET | Freq: Two times a day (BID) | ORAL | 5 refills | Status: DC

## 2024-08-06 NOTE — Telephone Encounter (Signed)
 Pt aware, agreeable, and verbalized understanding. Will pick up new decreased dose of entresto , and verbalized that he will his split his current dose in half until he gets the new ones. Verbalized that he will return to [facility] for repeat lab work on 12/22. He denied any other questions or concerns at this time.

## 2024-08-06 NOTE — Telephone Encounter (Signed)
-----   Message from Ellouise DELENA Class sent at 08/06/2024 11:23 AM EST ----- Kidney function a little worse. Decrease entresto  to 24/26mg  BID. He can finish up his current dose by taking 1/2 tablet BID until gone. Make sure drinking 60-64 ounces of fluid daily.   Recheck BMET in 2 weeks.  ----- Message ----- From: Interface, Lab In [place] Sent: 08/06/2024  11:04 AM EST To: Ellouise DELENA Class, FNP

## 2024-08-31 ENCOUNTER — Telehealth: Payer: Self-pay | Admitting: Family

## 2024-08-31 NOTE — Progress Notes (Unsigned)
 "  ADVANCED HF CLINIC NOTE  Referring Physician: Johnie Perkins, PA-C Primary Care: Johnie Perkins, PA-C Primary Cardiologist: None HF provider: Cherrie Sieving, MD  Chief Complaint: HF    HPI:  Anthony Richard is a 79 y/o male with a history of PVC's, moderate mitral insufficiency, previous tobacco use (quit 2019) and chronic heart failure.    Myoview 12/19 EF 19% No ischemia or infarct Echo 8/21 EF 35-40%  Echo 04/11/23: EF 25%   Stress test 04/11/23: 1.  Negative ETT though specificity limited by baseline ECG abnormalities  2.  Severely reduced left ventricular function with estimated LV ejection fraction 20%  3.  Global hypokinesis  4.  No tentative evidence of for scar or ischemia   Zio 10/24  1. Sinus rhythm -  avg HR of 67 bpm. 2. 2266 runs of nonsustained Ventricular Tachycardia runs occurred -longest 11 beats 5. Frequent PVCs (7.2%, 96276)  cMRI  2/25 LVEF 17%.Small, midwall, basal septal LV late gadolinium enhancement at the RV insertion points. No evidence for infiltrative or inflammatory disease.SABRARVEF 25%   Sleep study 12/24: AHI 16.8   F/u Zio on mexilitene 1. Sinus rhythm - avg HR of 95 bpm.  2. 6895 runs of nonsustained Ventricular Tachycardia -longest 18 beat 4. Frequent PVCs (13.7%, 59016),  Mexilitene increased to 300 bid. Patient refused any further testing  Seen in [facility] 07/23/24 where lasix  was given X 3 days and then returned to PRN.    He presents today with a chief complaint of a HF visit. Currently denies any shortness of breath, fatigue, chest pain, palpitations, abdominal distention, pedal edema, difficulty sleeping. Trying to decrease his consumption of sugar, bread and sodas per his PCP recommendation. Not interested in pill packaging and says that his current system of using the bottles is working for him.   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   ROS: All systems negative except what is listed in HPI, PMH  and Problem List   Past Medical History:  Diagnosis Date   CHF (congestive heart failure) (HCC)    PVC (premature ventricular contraction)     Current Outpatient Medications  Medication Sig Dispense Refill   aspirin EC 81 MG tablet Take 81 mg by mouth daily. Swallow whole.     carvedilol  (COREG ) 3.125 MG tablet Take 1 tablet (3.125 mg total) by mouth 2 (two) times daily with a meal. 90 tablet 3   empagliflozin  (JARDIANCE ) 10 MG TABS tablet Take 1 tablet (10 mg total) by mouth daily before breakfast. 30 tablet 11   furosemide  (LASIX ) 40 MG tablet Take 1 tablet (40 mg total) by mouth daily as needed for fluid. 90 tablet 0   mexiletine (MEXITIL) 150 MG capsule Take 2 capsules (300 mg total) by mouth 2 (two) times daily. 120 capsule 6   potassium chloride  SA (KLOR-CON  M) 20 MEQ tablet TAKE 1 TABLET BY MOUTH EVERY DAY (Patient not taking: Reported on 07/23/2024) 90 tablet 3   sacubitril -valsartan  (ENTRESTO ) 24-26 MG Take 1 tablet by mouth 2 (two) times daily. 60 tablet 5   No current facility-administered medications for this visit.    Allergies  Allergen Reactions   Lisinopril Hives   Penicillins Swelling   Latex Rash      Social History   Socioeconomic History   Marital status: Married    Spouse name: Not on file   Number of children: Not on file   Years of education: Not on file   Highest education  level: Not on file  Occupational History   Not on file  Tobacco Use   Smoking status: Former    Current packs/day: 0.00    Types: Cigarettes    Quit date: 2019    Years since quitting: 7.0   Smokeless tobacco: Never  Vaping Use   Vaping status: Never Used  Substance and Sexual Activity   Alcohol use: No    Alcohol/week: 0.0 standard drinks of alcohol   Drug use: No   Sexual activity: Not on file  Other Topics Concern   Not on file  Social History Narrative   Not on file   Social Drivers of Health   Tobacco Use: Medium Risk (08/20/2024)   Received from [facility]   Patient History    Smoking Tobacco Use: Former    Smokeless Tobacco Use: Never    Passive Exposure: Not on [place] Strain: Not on file  Food Insecurity: Not on file  Transportation Needs: Not on file  Physical Activity: Not on file  Stress: Not on file  Social Connections: Not on file  Intimate Partner Violence: Not on file  Depression (EYV7-0): Not on file  Alcohol Screen: Not on file  Housing: Unknown (08/13/2024)   Received from [facility]   Epic    Unable to Pay for Housing in the Last Year: Not on file    Number of Times Moved in the Last Year: Not on file    At any time in the past 12 months, were you homeless or living in a shelter (including now)?: No  Utilities: Not on file  Health Literacy: Not on file     No family history on file.  Vitals:   09/03/24 1349  BP: 111/85  Pulse: (!) 58  SpO2: 100%  Weight: 198 lb (89.8 kg)   Wt Readings from Last 3 Encounters:  09/03/24 198 lb (89.8 kg)  07/23/24 203 lb (92.1 kg)  04/26/24 197 lb (89.4 kg)   Lab Results  Component Value Date   CREATININE 1.71 (H) 08/06/2024   CREATININE 1.55 (H) 07/23/2024   CREATININE 1.48 (H) 12/19/2023    PHYSICAL EXAM:  General: Well appearing elderly male.  Cor: No JVD. Irregular rhythm, bradycardic.  Lungs: clear Abdomen: soft, nontender, nondistended. Extremities: trace pitting edema bilateral lower legs Neuro:. 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 No evidence for infiltrative or inflammatory disease.SABRARVEF 25%  - Stable NYHA I ,  euvolemic - Continue Lasix  40 / K+ 20meq PRN base on weight/ symptoms. He hasn't had to take this since last visit.  - Continue Entresto  24/26mg  bid.  - Continue carvedilol  3.125mg  BID.  - Continue jardiance  10mg  daily - Hasn't had spironolactone  filled since 02/25. K+ tends to run close to 5 or higher so will not resume this at this time - We have had multiple discussions about next steps including definitive ischemic eval (cath), genetic testing and referral for ICD. He seems to have limited understanding of his heart failure.  - He has been clear that at this time he does not want  to proceed with further testing modalities or ICD. If it my time, its my time. However he seemed more open to possibility of ICD down the road today. We will continue to discuss and see what happens when he is actually taking the mexilitene.  - Not candidate for advanced therapies currently - proBNP 07/23/24 was 2446.   2: PVC/NSVT- - prior holter monitoring in 2019 with 47% PVC burden  - zio 05/30/23: NSR with 2,200 runs NSVT. Frequent PVCs (7.2%, 96276), couplets (6.4%, 42564), and occasional triplets (1.5%, 6454) - F/u Zio 3/25 on mexilitene. Sinus rhythm - avg HR of 95 bpm, 6895 runs of NSVT-longest 18 beats, frequent PVCs (13.7%, 59016), - Has been taking mexiletine BID - Mg 07/23/24 was 2.2  3. Mild to moderate OSA - HST 12/24: AHI 16.8  - he has decided not to pursue CPAP  - We discussed other options for treatment - he is not interested  4. Hyperkalemia - BMET 08/13/24 reviewed: sodium 142, potassium 4.9, creatinine 1.7, GFR 40    Return in 2 months, sooner if needed. Will need to get on HF MD's schedule hopefully at next visit.   I spent 20 minutes reviewing records, interviewing/ examing patient and managing plan/ orders.   Ellouise DELENA Class, FNP-C 09/02/2024  "

## 2024-08-31 NOTE — Telephone Encounter (Signed)
 Called to confirm/remind patient of their appointment at the Advanced Heart Failure Clinic on 09/03/24.   Appointment:   [x] Confirmed  [] Left mess   [] No answer/No voice mail  [] VM Full/unable to leave message  [] Phone not in service  Patient reminded to bring all medications and/or complete list.  Confirmed patient has transportation. Gave directions, instructed to utilize valet parking.

## 2024-09-03 ENCOUNTER — Encounter: Payer: Self-pay | Admitting: Family

## 2024-09-03 ENCOUNTER — Ambulatory Visit: Attending: Family | Admitting: Family

## 2024-09-03 VITALS — BP 111/85 | HR 58 | Wt 198.0 lb

## 2024-09-03 DIAGNOSIS — Z7984 Long term (current) use of oral hypoglycemic drugs: Secondary | ICD-10-CM | POA: Diagnosis not present

## 2024-09-03 DIAGNOSIS — I493 Ventricular premature depolarization: Secondary | ICD-10-CM | POA: Diagnosis not present

## 2024-09-03 DIAGNOSIS — Z91040 Latex allergy status: Secondary | ICD-10-CM | POA: Insufficient documentation

## 2024-09-03 DIAGNOSIS — Z79899 Other long term (current) drug therapy: Secondary | ICD-10-CM | POA: Insufficient documentation

## 2024-09-03 DIAGNOSIS — I472 Ventricular tachycardia, unspecified: Secondary | ICD-10-CM | POA: Insufficient documentation

## 2024-09-03 DIAGNOSIS — E875 Hyperkalemia: Secondary | ICD-10-CM | POA: Diagnosis not present

## 2024-09-03 DIAGNOSIS — I5022 Chronic systolic (congestive) heart failure: Secondary | ICD-10-CM | POA: Insufficient documentation

## 2024-09-03 DIAGNOSIS — G4733 Obstructive sleep apnea (adult) (pediatric): Secondary | ICD-10-CM | POA: Diagnosis not present

## 2024-09-03 DIAGNOSIS — I340 Nonrheumatic mitral (valve) insufficiency: Secondary | ICD-10-CM | POA: Diagnosis not present

## 2024-09-03 DIAGNOSIS — Z87891 Personal history of nicotine dependence: Secondary | ICD-10-CM | POA: Insufficient documentation

## 2024-09-03 MED ORDER — SACUBITRIL-VALSARTAN 24-26 MG PO TABS: 1.0000 | ORAL_TABLET | Freq: Two times a day (BID) | ORAL | 5 refills | Status: AC

## 2024-09-03 NOTE — Patient Instructions (Addendum)
 It was good to see you today!  Medication Changes:  START Entresto  24-26mg  (1 tab) two times daily    Follow-Up in: Please follow up with the Advanced Heart Failure Clinic in March with Ellouise Class, FNP.   Thank you for choosing [facility] Advanced [facility] Failure [facility].    At the Advanced Heart Failure Clinic, you and your health needs are our priority. We have a designated team specialized in the treatment of Heart Failure. This Care Team includes your primary Heart Failure Specialized Cardiologist (physician), Advanced Practice Providers (APPs- Physician Assistants and Nurse Practitioners), and Pharmacist who all work together to provide you with the care you need, when you need it.   You may see any of the following providers on your designated Care Team at your next follow up:  Dr. Toribio Fuel Dr. Ezra Shuck Dr. Ria Commander Dr. Morene Brownie Ellouise Class, FNP Jaun Bash, RPH-CPP  Please be sure to bring in all your medications bottles to every appointment.   Need to Contact Us :  If you have any questions or concerns before your next appointment please send us  a message through [place] or call our office at [phone].    TO LEAVE A MESSAGE FOR THE NURSE SELECT OPTION 2, PLEASE LEAVE A MESSAGE INCLUDING: YOUR NAME DATE OF BIRTH CALL BACK NUMBER REASON FOR CALL**this is important as we prioritize the call backs  YOU WILL RECEIVE A CALL BACK THE SAME DAY AS LONG AS YOU CALL BEFORE 4:00 PM

## 2024-11-15 ENCOUNTER — Ambulatory Visit: Admitting: Family

## 2024-11-19 ENCOUNTER — Ambulatory Visit: Admitting: Family
